# Patient Record
Sex: Female | Born: 1970 | Race: White | Hispanic: No | Marital: Married | Smoking: Never smoker
Health system: Southern US, Community
[De-identification: ages and names within clinical notes are randomized; demographics above are authoritative.]

## PROBLEM LIST (undated history)

## (undated) DIAGNOSIS — N912 Amenorrhea, unspecified: Secondary | ICD-10-CM

## (undated) DIAGNOSIS — K8020 Calculus of gallbladder without cholecystitis without obstruction: Secondary | ICD-10-CM

## (undated) DIAGNOSIS — O1490 Unspecified pre-eclampsia, unspecified trimester: Secondary | ICD-10-CM

## (undated) HISTORY — DX: Unspecified pre-eclampsia, unspecified trimester: O14.90

## (undated) HISTORY — DX: Amenorrhea, unspecified: N91.2

---

## 1999-11-07 DIAGNOSIS — O1490 Unspecified pre-eclampsia, unspecified trimester: Secondary | ICD-10-CM

## 1999-11-07 DIAGNOSIS — O321XX0 Maternal care for breech presentation, not applicable or unspecified: Secondary | ICD-10-CM

## 2015-01-29 ENCOUNTER — Telehealth: Payer: Self-pay

## 2015-01-29 NOTE — Telephone Encounter (Signed)
Called patient and couldn't leave her a vm to call me back. I will call her later on again./MB  > From: Lorenza BurtonJONES, KANDICE L > To: Manahil Vanzile > Sent: 01/28/2015 11:29 AM > Please call patient to let her know that blood count, liver tests, celiac disease & thyroid labs all normal. Thanks

## 2015-01-30 NOTE — Progress Notes (Signed)
This encounter was created in error - please disregard.

## 2015-01-31 NOTE — Telephone Encounter (Signed)
Called patient and couldn't leave her a vm so I will send her a letter to call me back.

## 2015-02-05 ENCOUNTER — Telehealth: Payer: Self-pay | Admitting: Urgent Care

## 2015-02-05 NOTE — Telephone Encounter (Signed)
Pt has called about letter recieved in mail. Lab results were discussed. Pt was advised to call office to make appointment PRN.

## 2015-07-02 ENCOUNTER — Ambulatory Visit (INDEPENDENT_AMBULATORY_CARE_PROVIDER_SITE_OTHER): Payer: 59 | Admitting: Primary Care

## 2015-07-02 ENCOUNTER — Encounter: Payer: Self-pay | Admitting: Primary Care

## 2015-07-02 VITALS — BP 146/94 | HR 84 | Temp 97.8°F | Ht 66.5 in | Wt 155.8 lb

## 2015-07-02 DIAGNOSIS — R1084 Generalized abdominal pain: Secondary | ICD-10-CM | POA: Diagnosis not present

## 2015-07-02 DIAGNOSIS — IMO0001 Reserved for inherently not codable concepts without codable children: Secondary | ICD-10-CM | POA: Insufficient documentation

## 2015-07-02 DIAGNOSIS — R030 Elevated blood-pressure reading, without diagnosis of hypertension: Secondary | ICD-10-CM | POA: Diagnosis not present

## 2015-07-02 NOTE — Progress Notes (Signed)
Subjective:    Patient ID: Ashley Moss, female    DOB: 04/06/1971, 44 y.o.   MRN: [id number]  HPI  Ashley Moss is a 44 year old female who presents today to establish care and discuss the problems mentioned below. Will obtain old records. She had a wellness screen at work and tested her BMI, BP, and cholesterol.   1) Abdominal Pain: Present for the past 5-6 years intermittently and occur after eating, and sometimes after drinking. Over the past weeks she's noticed an increase in her symptoms. Her symptoms include: burning and bloating sensation each time she eats; tightness to her skin with discomfort requiring her to take her bra and work clothes off for relief; generalized abdominal discomfort with radiation to flanks bilaterally. She's been evaluated by two different GI specialists within the past 2 years without conclusion. She's tried eliminating dairy and gluten without improvement. Her symptoms are more noticeable with constipation, but will still occur when she's having regular bowel movements. She's not undergone colonoscopy or endoscopy. She tried taking Miralax, magnesium, linzess without improvement in symptoms. Denies fevers, vomiting, blood in stools. She's had some nausea. Her pain/discomfort will improve after putting on lose fitting clothes, and taking a warm bath. Denies fatigue, body aches, back pain.  Her diet currently consists of: Breakfast: English muffin or cereal Lunch: Soup, [place] sandwich, veggies and peanut butter, granola bar Dinner: Tacos, pasta, grilled chicken Snacks: Occasionally with peanut butter crackers, muffin. Desserts: Rare Beverages: Water, crystal light  Exercise: She is not routinely exercising   2) Elevated Blood Pressure Reading: Elevated in clinic today at 146/94. She's had this occur at her GYN office. She had a recent wellness screening at work and her BP was normal per patient. Denies chest pain, dizziness, shortness of  breath.   Review of Systems  Constitutional: Negative for fatigue and unexpected weight change.  HENT: Negative for rhinorrhea.   Respiratory: Negative for cough and shortness of breath.   Cardiovascular: Negative for chest pain.  Gastrointestinal: Positive for nausea, abdominal pain and constipation. Negative for vomiting and blood in stool.  Genitourinary: Negative for difficulty urinating.  Musculoskeletal: Negative for myalgias and arthralgias.  Skin: Negative for rash.  Allergic/Immunologic: Positive for environmental allergies.  Neurological: Positive for headaches.  Psychiatric/Behavioral:       Denies concerns for anxiety or depression       No past medical history on file.  Social History   Social History  . Marital Status: Married    Spouse Name: N/A  . Number of Children: N/A  . Years of Education: N/A   Occupational History  . Not on file.   Social History Main Topics  . Smoking status: Never Smoker   . Smokeless tobacco: Not on file  . Alcohol Use: 0.0 oz/week    0 Standard drinks or equivalent per week     Comment: soical  . Drug Use: Not on file  . Sexual Activity: Not on file   Other Topics Concern  . Not on file   Social History Narrative  . No narrative on file    No past surgical history on file.  No family history on file.  No Known Allergies  No current outpatient prescriptions on file prior to visit.   No current facility-administered medications on file prior to visit.    BP 146/94 mmHg  Pulse 84  Temp(Src) 97.8 F (36.6 C) (Oral)  Ht 5' 6.5" (1.689 m)  Wt 155 lb 12.8 oz (70.67 kg)  BMI 24.77 kg/m2  SpO2 98%    Objective:   Physical Exam  Constitutional: She is oriented to person, place, and time. She appears well-nourished.  Neck: Neck supple.  Cardiovascular: Normal rate and regular rhythm.   Pulmonary/Chest: Effort normal and breath sounds normal.  Abdominal: Soft. Bowel sounds are normal. There is generalized  tenderness. There is no CVA tenderness, no tenderness at McBurney's point and negative Murphy's sign.  Musculoskeletal:  Negative pressure points for fibromyalgia assessment.  Neurological: She is alert and oriented to person, place, and time.  Skin: Skin is warm and dry.  Psychiatric: She has a normal mood and affect.          Assessment & Plan:

## 2015-07-02 NOTE — Assessment & Plan Note (Signed)
Odd symptoms and presentation. Bloating, abdominal discomfort, sensitive to tight fitting clothes. Pain relief with removal of bra and work clothes and after warm bath. Negative assessment for fibromyalgia pressure points. Do not suspect autoimmune origin.  Generalized tenderness during exam. Will get basic labs today. Will obtain records. Will have her start on daily omeprazole 20 mg. Will contact patient once I receive records.

## 2015-07-02 NOTE — Assessment & Plan Note (Signed)
Elevated reading in office today, about the same on recheck. This has occurred before with GYN office. Health screening at work with normal reading per patient. She will send me a copy of her report. Will continue to monitor.

## 2015-07-02 NOTE — Progress Notes (Signed)
Pre visit review using our clinic review tool, if applicable. No additional management support is needed unless otherwise documented below in the visit note. 

## 2015-07-02 NOTE — Patient Instructions (Signed)
Start taking Omeprazole 20 mg daily for 4-6 weeks. This may be purchased over the counter. This may help with burning.   Complete lab work prior to leaving today. I will notify you of your results.  Please allow me some time to review your records to determine our next steps.  It was a pleasure to meet you today! Please don't hesitate to call me with any questions. Welcome to [facility]!

## 2015-07-03 LAB — CBC WITH DIFFERENTIAL/PLATELET
BASOS ABS: 0 10*3/uL (ref 0.0–0.2)
Basos: 0 %
EOS (ABSOLUTE): 0.1 10*3/uL (ref 0.0–0.4)
EOS: 1 %
HEMATOCRIT: 38.6 % (ref 34.0–46.6)
HEMOGLOBIN: 12.9 g/dL (ref 11.1–15.9)
IMMATURE GRANULOCYTES: 0 %
Immature Grans (Abs): 0 10*3/uL (ref 0.0–0.1)
Lymphocytes Absolute: 2.4 10*3/uL (ref 0.7–3.1)
Lymphs: 27 %
MCH: 28.7 pg (ref 26.6–33.0)
MCHC: 33.4 g/dL (ref 31.5–35.7)
MCV: 86 fL (ref 79–97)
MONOCYTES: 6 %
MONOS ABS: 0.5 10*3/uL (ref 0.1–0.9)
NEUTROS PCT: 66 %
Neutrophils Absolute: 6 10*3/uL (ref 1.4–7.0)
Platelets: 253 10*3/uL (ref 150–379)
RBC: 4.5 x10E6/uL (ref 3.77–5.28)
RDW: 12.8 % (ref 12.3–15.4)
WBC: 9.1 10*3/uL (ref 3.4–10.8)

## 2015-07-03 LAB — COMPREHENSIVE METABOLIC PANEL
A/G RATIO: 1.6 (ref 1.1–2.5)
ALT: 9 IU/L (ref 0–32)
AST: 15 IU/L (ref 0–40)
Albumin: 4.2 g/dL (ref 3.5–5.5)
Alkaline Phosphatase: 54 IU/L (ref 39–117)
BILIRUBIN TOTAL: 0.3 mg/dL (ref 0.0–1.2)
BUN/Creatinine Ratio: 13 (ref 9–23)
BUN: 7 mg/dL (ref 6–24)
CALCIUM: 9.3 mg/dL (ref 8.7–10.2)
CHLORIDE: 101 mmol/L (ref 97–106)
CO2: 23 mmol/L (ref 18–29)
CREATININE: 0.56 mg/dL — AB (ref 0.57–1.00)
GFR calc Af Amer: 132 mL/min/{1.73_m2} (ref >59)
GFR calc non Af Amer: 115 mL/min/{1.73_m2} (ref >59)
GLOBULIN, TOTAL: 2.6 g/dL (ref 1.5–4.5)
Glucose: 103 mg/dL — ABNORMAL HIGH (ref 65–99)
Potassium: 4.7 mmol/L (ref 3.5–5.2)
Sodium: 139 mmol/L (ref 136–144)
TOTAL PROTEIN: 6.8 g/dL (ref 6.0–8.5)

## 2015-07-03 LAB — LIPASE: LIPASE: 60 U/L — AB (ref 0–59)

## 2015-07-07 ENCOUNTER — Encounter: Payer: Self-pay | Admitting: *Deleted

## 2015-07-14 ENCOUNTER — Telehealth: Payer: Self-pay | Admitting: Primary Care

## 2015-07-14 NOTE — Telephone Encounter (Signed)
Pt returned your call best number to call [phone] She goes to lunch between 1-2

## 2015-07-15 NOTE — Telephone Encounter (Signed)
Pt returned your call work number [phone]

## 2015-07-16 NOTE — Telephone Encounter (Signed)
Tried to call patient at work number but did not get respond. However, already sent patient letter with lab results and Kate's comments.

## 2015-07-17 NOTE — Telephone Encounter (Signed)
Patient returned Chan's call.  Please call her back at work  [phone].

## 2015-07-17 NOTE — Telephone Encounter (Signed)
Called patient. Notified patient that since could not get a hold of her after her visit on 07/02/2015, I sent a letter with the lab results and Kate's comments. Patient verbalized understanding and she did get the letter. Patient stated that she wanted [name] to know that patient gets pain with just about anytime when she eat certain food. Patient stated that is it possible to be salt intolerance, it there just a thing? Since she ate some chicken noodle soup and then one time she ate some french fries. She was having some abdominal pain. Please advise. Patient is aware that [name] will not be back until Monday.

## 2015-07-17 NOTE — Telephone Encounter (Signed)
This sounds like reflux. Has she been taking the Prilosec 20 mg daily?

## 2015-07-18 NOTE — Telephone Encounter (Signed)
Message left for patient to return my call.  

## 2015-07-21 ENCOUNTER — Telehealth: Payer: Self-pay | Admitting: Primary Care

## 2015-07-21 NOTE — Telephone Encounter (Signed)
Patient is taking omeprazole as discussed. Patient wanted to know any other suggestions.

## 2015-07-21 NOTE — Telephone Encounter (Signed)
If she's found that her pain is caused by certain foods (i.e. Salty foods), then I highly recommend she avoid them. Has the omeprazole helped at all?

## 2015-07-21 NOTE — Telephone Encounter (Signed)
Tried to call patient. Phone kept ringing. Tried this morning and this afternoon. Will try again tomorrow.

## 2015-07-23 NOTE — Telephone Encounter (Signed)
Called and notified patient of Ashley Moss's comments. Patient verbalized understanding. Patient wanted to know if [name] have seen her records. Patient taking omeprazole but still have pain.

## 2015-07-25 ENCOUNTER — Telehealth: Payer: Self-pay | Admitting: Primary Care

## 2015-07-25 NOTE — Telephone Encounter (Signed)
I did receive her records recently and reviewed them. It doesn't appear that she's had an a CT scan completed of her abdomen.   1. Has she had a CT scan done? If not, then I think this would be the next step in investigating her symptoms. 2. Does she have a copy her her recent lab work that was completed through her occupation? If so, will she send over? 3. Has she ever had an endoscopy or colonoscopy, it doesn't appear that way from the records I've received. 4. Has GI ever discussed endoscopy or colonoscopy?

## 2015-07-28 NOTE — Telephone Encounter (Signed)
Message left for patient to return my call.  

## 2015-07-29 NOTE — Telephone Encounter (Signed)
Message left for patient to return my call from patient's work number [phone]

## 2015-07-30 NOTE — Telephone Encounter (Signed)
Called and asked patient of Kate's questions. Patient verbalized understanding.  Patient stated that  1. Not sure if ever had CT scan but did have x-ray. 2. She will send a copy of her recent labs from work, she have look for it and will 3. No, she never had an endoscopy or colonoscopy. 4. No, she have not discussed.

## 2015-07-31 ENCOUNTER — Telehealth: Payer: Self-pay | Admitting: Primary Care

## 2015-07-31 DIAGNOSIS — R1084 Generalized abdominal pain: Secondary | ICD-10-CM

## 2015-07-31 NOTE — Telephone Encounter (Signed)
Lets do a CT scan for further evaluation if her symptoms are unchanged. I have placed the orders and someone will be in touch with her soon.

## 2015-08-01 ENCOUNTER — Other Ambulatory Visit: Payer: Self-pay | Admitting: Primary Care

## 2015-08-01 ENCOUNTER — Telehealth: Payer: Self-pay | Admitting: Primary Care

## 2015-08-01 NOTE — Telephone Encounter (Signed)
Called patient and she stated that she would like to know how much would be the CT scan. She is having some financial issues with other medical bills so like to know first..

## 2015-08-01 NOTE — Telephone Encounter (Signed)
Spoke to patient and she wanted to know how much would the CT scan be out of pocket. The reason is patient have a medical bill when her son was in the hospital and a bill from husband's colonscopy. Price on CT Abdomen Pelvis W Contrast.

## 2015-08-01 NOTE — Telephone Encounter (Signed)
The price varies with different insurances. She should call her insurance provider for the answer to that question. We can hold off until she finds out.

## 2016-01-21 ENCOUNTER — Telehealth: Payer: Self-pay | Admitting: *Deleted

## 2016-01-21 DIAGNOSIS — R1084 Generalized abdominal pain: Secondary | ICD-10-CM

## 2016-01-21 NOTE — Telephone Encounter (Signed)
Will order CT scan. Please notify patient that someone will be in contact with her soon. She will also need updated labs to ensure kidney function is stable. Please set her up for lab only appointment tomorrow or Friday this week.

## 2016-01-21 NOTE — Telephone Encounter (Signed)
Patient left a voicemail stating that she has been seen for abdominal pain in the past and her symptoms have not gotten any better. Patient wants to know if you can order a CT scan for her?

## 2016-01-21 NOTE — Telephone Encounter (Signed)
Spoken and notified patient of Kate's comments. Patient verbalized understanding.  Lab appt on 01/22/16

## 2016-01-22 ENCOUNTER — Other Ambulatory Visit (INDEPENDENT_AMBULATORY_CARE_PROVIDER_SITE_OTHER): Payer: 59

## 2016-01-22 DIAGNOSIS — R1084 Generalized abdominal pain: Secondary | ICD-10-CM

## 2016-01-23 LAB — COMPREHENSIVE METABOLIC PANEL
A/G RATIO: 1.5 (ref 1.2–2.2)
ALBUMIN: 4.3 g/dL (ref 3.5–5.5)
ALT: 13 IU/L (ref 0–32)
AST: 19 IU/L (ref 0–40)
Alkaline Phosphatase: 60 IU/L (ref 39–117)
BUN / CREAT RATIO: 19 (ref 9–23)
BUN: 12 mg/dL (ref 6–24)
Bilirubin Total: 0.3 mg/dL (ref 0.0–1.2)
CALCIUM: 9.8 mg/dL (ref 8.7–10.2)
CO2: 19 mmol/L (ref 18–29)
CREATININE: 0.63 mg/dL (ref 0.57–1.00)
Chloride: 103 mmol/L (ref 96–106)
GFR calc Af Amer: 126 mL/min/{1.73_m2} (ref >59)
GFR, EST NON AFRICAN AMERICAN: 109 mL/min/{1.73_m2} (ref >59)
GLOBULIN, TOTAL: 2.9 g/dL (ref 1.5–4.5)
Glucose: 101 mg/dL — ABNORMAL HIGH (ref 65–99)
POTASSIUM: 5.2 mmol/L (ref 3.5–5.2)
SODIUM: 142 mmol/L (ref 134–144)
Total Protein: 7.2 g/dL (ref 6.0–8.5)

## 2016-01-30 ENCOUNTER — Ambulatory Visit
Admission: RE | Admit: 2016-01-30 | Discharge: 2016-01-30 | Disposition: A | Discharge status: Home / Self Care | Payer: 59 | Source: Ambulatory Visit | Attending: Primary Care | Admitting: Primary Care

## 2016-01-30 DIAGNOSIS — K8020 Calculus of gallbladder without cholecystitis without obstruction: Secondary | ICD-10-CM | POA: Insufficient documentation

## 2016-01-30 DIAGNOSIS — R1084 Generalized abdominal pain: Secondary | ICD-10-CM

## 2016-01-30 MED ORDER — IOPAMIDOL (ISOVUE-300) INJECTION 61%
100.0000 mL | Freq: Once | INTRAVENOUS | Status: AC | PRN
  Administered 2016-01-30: 100 mL via INTRAVENOUS

## 2016-02-09 ENCOUNTER — Encounter: Payer: Self-pay | Admitting: Primary Care

## 2016-02-09 ENCOUNTER — Ambulatory Visit (INDEPENDENT_AMBULATORY_CARE_PROVIDER_SITE_OTHER): Payer: 59 | Admitting: Primary Care

## 2016-02-09 VITALS — BP 130/80 | HR 96 | Temp 98.4°F | Ht 67.0 in | Wt 155.1 lb

## 2016-02-09 DIAGNOSIS — R030 Elevated blood-pressure reading, without diagnosis of hypertension: Secondary | ICD-10-CM | POA: Diagnosis not present

## 2016-02-09 DIAGNOSIS — K8020 Calculus of gallbladder without cholecystitis without obstruction: Secondary | ICD-10-CM | POA: Diagnosis not present

## 2016-02-09 DIAGNOSIS — R1084 Generalized abdominal pain: Secondary | ICD-10-CM | POA: Diagnosis not present

## 2016-02-09 DIAGNOSIS — IMO0001 Reserved for inherently not codable concepts without codable children: Secondary | ICD-10-CM

## 2016-02-09 NOTE — Progress Notes (Signed)
Subjective:    Patient ID: Ashley Moss, female    DOB: 05/02/1971, 45 y.o.   MRN: [id number]  HPI  Ashley Moss is a 45 year old female who presents today for follow up of abdominal pain. She was evaluated as a new patient in November 2016 with complaints of abdominal discomfort, bloating, clothing fitting too tightly at the end of the day. She had prior work up per GI in May 2016 who had an unremarkable workup at the time. She underwent lab testing in November 2016 through our clinic which was unremarkable. It was advised last visit that she undergo CT of her abdomen, but she declined as she could not afford at the time.   She called several weeks ago with continued complaints of symptoms without improvement on PPI and was agreeable to the CT scan. She recently underwent CT scan of her abdomen/pelvis which showed cholelithiasis.   Since her last visit she's continued to notice discomfort and bloating to her bilateral lower abdomen and lateral sides. Her symptoms will wax and wane with various days in no particular pattern. She feels as though her sides are "bruised" by the end of the day in regards to the tightness of her close caused by bloating. Her discomfort is mainly located to her right lower side but does experience epigastric and right upper quadrant discomfort. She typically experiences discomfort after eating. She's tried switching her diet mulitple times and has not found improvement. She is having bowel movements 3-4 times weekly. Denies diarrhea, nausea, vomiting, bloody stools. She has never undergone food allergy testing.    Review of Systems  Constitutional: Negative for appetite change and unexpected weight change.  Gastrointestinal: Positive for abdominal pain. Negative for nausea, vomiting, diarrhea, constipation and blood in stool.       Bloating  Neurological: Negative for weakness.       No past medical history on file.   Social History   Social History  .  Marital Status: Married    Spouse Name: N/A  . Number of Children: N/A  . Years of Education: N/A   Occupational History  . Not on file.   Social History Main Topics  . Smoking status: Never Smoker   . Smokeless tobacco: Not on file  . Alcohol Use: 0.0 oz/week    0 Standard drinks or equivalent per week     Comment: soical  . Drug Use: Not on file  . Sexual Activity: Not on file   Other Topics Concern  . Not on file   Social History Narrative    No past surgical history on file.  No family history on file.  No Known Allergies  Current Outpatient Prescriptions on File Prior to Visit  Medication Sig Dispense Refill  . levonorgestrel-ethinyl estradiol (AVIANE,ALESSE,LESSINA) 0.1-20 MG-MCG tablet Take 1 tablet by mouth daily.      No current facility-administered medications on file prior to visit.    BP 130/80 mmHg  Pulse 96  Temp(Src) 98.4 F (36.9 C) (Oral)  Ht 5\' 7"  (1.702 m)  Wt 155 lb 1.9 oz (70.362 kg)  BMI 24.29 kg/m2  SpO2 97%  LMP 01/13/2016 (Approximate)    Objective:   Physical Exam  Constitutional: She appears well-nourished.  Cardiovascular: Normal rate and regular rhythm.   Pulmonary/Chest: Effort normal and breath sounds normal.  Abdominal: Soft. There is tenderness in the right upper quadrant, right lower quadrant and epigastric area. There is no rebound, no guarding and negative Murphy's sign.  Skin:  Skin is warm and dry.          Assessment & Plan:

## 2016-02-09 NOTE — Patient Instructions (Signed)
You will be contacted regarding your referral to General Surgery.  Please let us know if you have not heard back within one week.   It was a pleasure to see you today!

## 2016-02-09 NOTE — Assessment & Plan Note (Signed)
Continues to experience abdominal symptoms. Recent CT results do show cholelithiasis without cholecystitis. Given her persistent symptoms I do believe it is worthy of general surgery consult. Exam with tenderness to right upper quadrant and epigastric region without a positive Murphy's sign. Recent labs unremarkable. Referral placed.

## 2016-02-09 NOTE — Progress Notes (Signed)
Pre visit review using our clinic review tool, if applicable. No additional management support is needed unless otherwise documented below in the visit note. 

## 2016-02-09 NOTE — Assessment & Plan Note (Signed)
Stable today. We'll continue to monitor.

## 2016-02-10 ENCOUNTER — Encounter: Payer: Self-pay | Admitting: *Deleted

## 2016-02-11 ENCOUNTER — Ambulatory Visit (INDEPENDENT_AMBULATORY_CARE_PROVIDER_SITE_OTHER): Payer: 59 | Admitting: General Surgery

## 2016-02-11 ENCOUNTER — Encounter: Payer: Self-pay | Admitting: General Surgery

## 2016-02-11 VITALS — BP 122/76 | HR 74 | Resp 12 | Ht 67.0 in | Wt 154.0 lb

## 2016-02-11 DIAGNOSIS — K8020 Calculus of gallbladder without cholecystitis without obstruction: Secondary | ICD-10-CM | POA: Diagnosis not present

## 2016-02-11 NOTE — Patient Instructions (Addendum)
Laparoscopic Cholecystectomy Laparoscopic cholecystectomy is surgery to remove the gallbladder. The gallbladder is located in the upper right part of the abdomen, behind the liver. It is a storage sac for bile, which is produced in the liver. Bile aids in the digestion and absorption of fats. Cholecystectomy is often done for inflammation of the gallbladder (cholecystitis). This condition is usually caused by a buildup of gallstones (cholelithiasis) in the gallbladder. Gallstones can block the flow of bile, and that can result in inflammation and pain. In severe cases, emergency surgery may be required. If emergency surgery is not required, you will have time to prepare for the procedure. Laparoscopic surgery is an alternative to open surgery. Laparoscopic surgery has a shorter recovery time. Your common bile duct may also need to be examined during the procedure. If stones are found in the common bile duct, they may be removed. LET YOUR HEALTH CARE PROVIDER KNOW ABOUT:  Any allergies you have.  All medicines you are taking, including vitamins, herbs, eye drops, creams, and over-the-counter medicines.  Previous problems you or members of your family have had with the use of anesthetics.  Any blood disorders you have.  Previous surgeries you have had.  Any medical conditions you have. RISKS AND COMPLICATIONS Generally, this is a safe procedure. However, problems may occur, including:  Infection.  Bleeding.  Allergic reactions to medicines.  Damage to other structures or organs.  A stone remaining in the common bile duct.  A bile leak from the cyst duct that is clipped when your gallbladder is removed.  The need to convert to open surgery, which requires a larger incision in the abdomen. This may be necessary if your surgeon thinks that it is not safe to continue with a laparoscopic procedure. BEFORE THE PROCEDURE  Ask your health care provider about:  Changing or stopping your  regular medicines. This is especially important if you are taking diabetes medicines or blood thinners.  Taking medicines such as aspirin and ibuprofen. These medicines can thin your blood. Do not take these medicines before your procedure if your health care provider instructs you not to.  Follow instructions from your health care provider about eating or drinking restrictions.  Let your health care provider know if you develop a cold or an infection before surgery.  Plan to have someone take you home after the procedure.  Ask your health care provider how your surgical site will be marked or identified.  You may be given antibiotic medicine to help prevent infection. PROCEDURE  To reduce your risk of infection:  Your health care team will wash or sanitize their hands.  Your skin will be washed with soap.  An IV tube may be inserted into one of your veins.  You will be given a medicine to make you fall asleep (general anesthetic).  A breathing tube will be placed in your mouth.  The surgeon will make several small cuts (incisions) in your abdomen.  A thin, lighted tube (laparoscope) that has a tiny camera on the end will be inserted through one of the small incisions. The camera on the laparoscope will send a picture to a TV screen (monitor) in the operating room. This will give the surgeon a good view inside your abdomen.  A gas will be pumped into your abdomen. This will expand your abdomen to give the surgeon more room to perform the surgery.  Other tools that are needed for the procedure will be inserted through the other incisions. The gallbladder will   be removed through one of the incisions.  After your gallbladder has been removed, the incisions will be closed with stitches (sutures), staples, or skin glue.  Your incisions may be covered with a bandage (dressing). The procedure may vary among health care providers and hospitals. AFTER THE PROCEDURE  Your blood  pressure, heart rate, breathing rate, and blood oxygen level will be monitored often until the medicines you were given have worn off.  You will be given medicines as needed to control your pain.   This information is not intended to replace advice given to you by your health care provider. Make sure you discuss any questions you have with your health care provider.   Document Released: 08/16/2005 Document Revised: 05/07/2015 Document Reviewed: 03/28/2013 Elsevier Interactive Patient Education 2016 [employer]  Patient's surgery has been [facility].

## 2016-02-11 NOTE — Progress Notes (Signed)
Patient ID: Ashley Moss, female   DOB: July 12, 1971, 45 y.o.   MRN: [id number]  Chief Complaint  Patient presents with  . Abdominal Pain    HPI Ashley Moss is a 45 y.o. female here today for a evaluation of abdominal pain.The main pain is in her right  side below the costal margin, but does experience epigastric and right llower quadrant discomfort as well. Feels bloating, which becomes more pronounced is a was on. Discomfort after eating, although she has not noticed any discrete dietary intolerance. Pain can radiate into the right subscapular region when severe.. This has been going on for over five year now but, more in pain in the last year.  Denies diarrhea, nausea, vomiting. Moves her bowel movements 3-4 times weekly.Patient had a ct scan on 01/30/16.   I personally reviewed the patient's history HPI  History reviewed. No pertinent past medical history.  Past Surgical History  Procedure Laterality Date  . Cesarean section  2001    History reviewed. No pertinent family history.  Social History Social History  Substance Use Topics  . Smoking status: Never Smoker   . Smokeless tobacco: None  . Alcohol Use: 0.0 oz/week    0 Standard drinks or equivalent per week     Comment: soical    No Known Allergies  Current Outpatient Prescriptions  Medication Sig Dispense Refill  . levonorgestrel-ethinyl estradiol (AVIANE,ALESSE,LESSINA) 0.1-20 MG-MCG tablet Take 1 tablet by mouth daily.      No current facility-administered medications for this visit.    Review of Systems Review of Systems  Constitutional: Negative.   Respiratory: Negative.   Cardiovascular: Negative.     Blood pressure 122/76, pulse 74, resp. rate 12, height  (1.702 m), weight 154 lb (69.854 kg), last menstrual period 01/13/2016.  Physical Exam Physical Exam  Constitutional: She is oriented to person, place, and time. She appears well-developed and well-nourished.  Eyes: Conjunctivae  are normal. No scleral icterus.  Neck: Neck supple.  Cardiovascular: Normal rate, regular rhythm and normal heart sounds.   Pulmonary/Chest: Effort normal and breath sounds normal.  Abdominal: Soft. Normal appearance and bowel sounds are normal. There is no hepatomegaly. There is tenderness in the right upper quadrant.  Lymphadenopathy:    She has no cervical adenopathy.  Neurological: She is alert and oriented to person, place, and time.  Skin: Skin is warm.    Data Reviewed 01/30/2016 CT scan of the abdomen and pelvis was reviewed. Multiple gallstones without evidence of acute cholecystitis. 01/22/2016 competence metabolic panel unremarkable except for a blood sugar of 101.  Assessment    Symptomatic cholelithiasis.    Plan     Laparoscopic Cholecystectomy with Intraoperative Cholangiogram. The procedure, including it's potential risks and complications (including but not limited to infection, bleeding, injury to intra-abdominal organs or bile ducts, bile leak, poor cosmetic result, sepsis and death) were discussed with the patient in detail. Non-operative options, including their inherent risks (acute calculous cholecystitis with possible choledocholithiasis or gallstone pancreatitis, with the risk of ascending cholangitis, sepsis, and death) were discussed as well. The patient expressed and understanding of what we discussed and wishes to proceed with laparoscopic cholecystectomy. The patient further understands that if it is technically not possible, or it is unsafe to proceed laparoscopically, that I will convert to an open cholecystectomy.  Patient's surgery has been scheduled for 03-04-16 at [facility].    PCP:  Vernona Rieger  This information has been scribed by Ples Specter CMA.  Earline MayotteByrnett, Courtenay Hirth W 02/12/2016, 4:22 PM

## 2016-02-12 DIAGNOSIS — K8020 Calculus of gallbladder without cholecystitis without obstruction: Secondary | ICD-10-CM | POA: Insufficient documentation

## 2016-02-12 NOTE — H&P (Signed)
HPI Ashley BurowsStephanie Lane Moss is a 45 y.o. female here today for a evaluation of abdominal pain.The main pain is in her right  side below the costal margin, but does experience epigastric and right llower quadrant discomfort as well. Feels bloating, which becomes more pronounced is a was on. Discomfort after eating, although she has not noticed any discrete dietary intolerance. Pain can radiate into the right subscapular region when severe.. This has been going on for over five year now but, more in pain in the last year.  Denies diarrhea, nausea, vomiting. Moves her bowel movements 3-4 times weekly.Patient had a ct scan on 01/30/16.   I personally reviewed the patient's history HPI  History reviewed. No pertinent past medical history.  Past Surgical History  Procedure Laterality Date  . Cesarean section  2001    History reviewed. No pertinent family history.  Social History Social History  Substance Use Topics  . Smoking status: Never Smoker   . Smokeless tobacco: None  . Alcohol Use: 0.0 oz/week    0 Standard drinks or equivalent per week     Comment: soical    No Known Allergies  Current Outpatient Prescriptions  Medication Sig Dispense Refill  . levonorgestrel-ethinyl estradiol (AVIANE,ALESSE,LESSINA) 0.1-20 MG-MCG tablet Take 1 tablet by mouth daily.      No current facility-administered medications for this visit.    Review of Systems Review of Systems  Constitutional: Negative.   Respiratory: Negative.   Cardiovascular: Negative.     Blood pressure 122/76, pulse 74, resp. rate 12, height 5\' 7"  (1.702 m), weight 154 lb (69.854 kg), last menstrual period 01/13/2016.  Physical Exam Physical Exam  Constitutional: She is oriented to person, place, and time. She appears well-developed and well-nourished.  Eyes: Conjunctivae are normal. No scleral icterus.  Neck: Neck supple.  Cardiovascular: Normal rate, regular rhythm and normal heart sounds.   Pulmonary/Chest: Effort  normal and breath sounds normal.  Abdominal: Soft. Normal appearance and bowel sounds are normal. There is no hepatomegaly. There is tenderness in the right upper quadrant.  Lymphadenopathy:    She has no cervical adenopathy.  Neurological: She is alert and oriented to person, place, and time.  Skin: Skin is warm.    Data Reviewed 01/30/2016 CT scan of the abdomen and pelvis was reviewed. Multiple gallstones without evidence of acute cholecystitis. 01/22/2016 competence metabolic panel unremarkable except for a blood sugar of 101.  Assessment    Symptomatic cholelithiasis.    Plan     Laparoscopic Cholecystectomy with Intraoperative Cholangiogram. The procedure, including it's potential risks and complications (including but not limited to infection, bleeding, injury to intra-abdominal organs or bile ducts, bile leak, poor cosmetic result, sepsis and death) were discussed with the patient in detail. Non-operative options, including their inherent risks (acute calculous cholecystitis with possible choledocholithiasis or gallstone pancreatitis, with the risk of ascending cholangitis, sepsis, and death) were discussed as well. The patient expressed and understanding of what we discussed and wishes to proceed with laparoscopic cholecystectomy. The patient further understands that if it is technically not possible, or it is unsafe to proceed laparoscopically, that I will convert to an open cholecystectomy.  [facility].    PCP:  Vernona Riegerlark, Katherine  This information has been scribed by Ples SpecterJessica Qualls CMA.    Earline MayotteByrnett, Ashley Moss 02/12/2016, 4:22 PM

## 2016-02-17 ENCOUNTER — Telehealth: Payer: Self-pay | Admitting: *Deleted

## 2016-02-17 NOTE — Telephone Encounter (Signed)
RTW 03-15-16

## 2016-02-17 NOTE — Telephone Encounter (Signed)
Need a return to work date for [employer] papers if possible.

## 2016-02-26 ENCOUNTER — Encounter: Payer: Self-pay | Admitting: *Deleted

## 2016-02-26 ENCOUNTER — Inpatient Hospital Stay: Admission: RE | Admit: 2016-02-26 | Payer: 59 | Source: Ambulatory Visit

## 2016-02-26 NOTE — Patient Instructions (Signed)
  Your procedure is scheduled on: 03-04-16 Report to Same Day Surgery 2nd floor medical mall To find out your arrival time please call [phone] between 1PM - 3PM on 03-03-16  Remember: Instructions that are not followed completely may result in serious medical risk, up to and including death, or upon the discretion of your surgeon and anesthesiologist your surgery may need to be rescheduled.    _x___ 1. Do not eat food or drink liquids after midnight. No gum chewing or hard candies.     __x__ 2. No Alcohol for 24 hours before or after surgery.   __x__3. No Smoking for 24 prior to surgery.   ____  4. Bring all medications with you on the day of surgery if instructed.    __x__ 5. Notify your doctor if there is any change in your medical condition     (cold, fever, infections).     Do not wear jewelry, make-up, hairpins, clips or nail polish.  Do not wear lotions, powders, or perfumes. You may wear deodorant.  Do not shave 48 hours prior to surgery. Men may shave face and neck.  Do not bring valuables to the hospital.    [facility] is not responsible for any belongings or valuables.               Contacts, dentures or bridgework may not be worn into surgery.  Leave your suitcase in the car. After surgery it may be brought to your room.  For patients admitted to the hospital, discharge time is determined by your treatment team.   Patients discharged the day of surgery will not be allowed to drive home.    Please read over the following fact sheets that you were given:   [facility] Preparing for Surgery and or MRSA Information   _x___ Take these medicines the morning of surgery with A SIP OF WATER:    1. BIRTH CONTROL PILL  2.  3.  4.  5.  6.  ____ Fleet Enema (as directed)   ____ Use CHG Soap or sage wipes as directed on instruction sheet   ____ Use inhalers on the day of surgery and bring to hospital day of surgery  ____ Stop metformin 2 days prior to surgery    ____  Take 1/2 of usual insulin dose the night before surgery and none on the morning of surgery.   ____ Stop aspirin or coumadin, or plavix  ___ Stop Anti-inflammatories such as Advil, Aleve, Ibuprofen, Motrin, Naproxen,          Naprosyn, Goodies powders or aspirin products. Ok to take Tylenol.-DR BYRNETT OK WITH HIS PTS CONTINUING NSAIDS   _X___ Stop supplements until after surgery-STOP BIOTIN NOW   ____ Bring C-Pap to the hospital.

## 2016-03-04 ENCOUNTER — Ambulatory Visit: Payer: 59 | Admitting: Anesthesiology

## 2016-03-04 ENCOUNTER — Ambulatory Visit: Payer: 59

## 2016-03-04 ENCOUNTER — Encounter
Admission: RE | Disposition: A | Discharge status: Home / Self Care | Payer: Self-pay | Source: Ambulatory Visit | Attending: General Surgery

## 2016-03-04 ENCOUNTER — Encounter: Payer: Self-pay | Admitting: *Deleted

## 2016-03-04 ENCOUNTER — Ambulatory Visit
Admission: RE | Admit: 2016-03-04 | Discharge: 2016-03-04 | Disposition: A | Discharge status: Home / Self Care | Payer: 59 | Source: Ambulatory Visit | Attending: General Surgery | Admitting: General Surgery

## 2016-03-04 DIAGNOSIS — Q453 Other congenital malformations of pancreas and pancreatic duct: Secondary | ICD-10-CM | POA: Insufficient documentation

## 2016-03-04 DIAGNOSIS — Z793 Long term (current) use of hormonal contraceptives: Secondary | ICD-10-CM | POA: Diagnosis not present

## 2016-03-04 DIAGNOSIS — K8010 Calculus of gallbladder with chronic cholecystitis without obstruction: Secondary | ICD-10-CM | POA: Diagnosis not present

## 2016-03-04 DIAGNOSIS — K8020 Calculus of gallbladder without cholecystitis without obstruction: Secondary | ICD-10-CM

## 2016-03-04 DIAGNOSIS — Z9889 Other specified postprocedural states: Secondary | ICD-10-CM | POA: Insufficient documentation

## 2016-03-04 DIAGNOSIS — K8044 Calculus of bile duct with chronic cholecystitis without obstruction: Secondary | ICD-10-CM | POA: Diagnosis not present

## 2016-03-04 HISTORY — PX: CHOLECYSTECTOMY: SHX55

## 2016-03-04 HISTORY — DX: Calculus of gallbladder without cholecystitis without obstruction: K80.20

## 2016-03-04 LAB — POCT PREGNANCY, URINE: PREG TEST UR: NEGATIVE

## 2016-03-04 SURGERY — LAPAROSCOPIC CHOLECYSTECTOMY WITH INTRAOPERATIVE CHOLANGIOGRAM
Anesthesia: General | Wound class: Clean Contaminated

## 2016-03-04 MED ORDER — ACETAMINOPHEN 10 MG/ML IV SOLN
INTRAVENOUS | Status: AC
  Filled 2016-03-04: qty 100

## 2016-03-04 MED ORDER — GLYCOPYRROLATE 0.2 MG/ML IJ SOLN
INTRAMUSCULAR | Status: DC | PRN
  Administered 2016-03-04: .4 mg via INTRAVENOUS

## 2016-03-04 MED ORDER — HYDROMORPHONE HCL 1 MG/ML IJ SOLN
0.2500 mg | INTRAMUSCULAR | Status: DC | PRN
  Administered 2016-03-04 (×2): 0.25 mg via INTRAVENOUS

## 2016-03-04 MED ORDER — PROPOFOL 10 MG/ML IV BOLUS
INTRAVENOUS | Status: DC | PRN
  Administered 2016-03-04: 140 mg via INTRAVENOUS

## 2016-03-04 MED ORDER — HYDROCODONE-ACETAMINOPHEN 5-325 MG PO TABS
ORAL_TABLET | ORAL | Status: AC
  Administered 2016-03-04: 1 via ORAL
  Filled 2016-03-04: qty 1

## 2016-03-04 MED ORDER — FENTANYL CITRATE (PF) 100 MCG/2ML IJ SOLN
25.0000 ug | INTRAMUSCULAR | Status: DC | PRN
  Administered 2016-03-04 (×4): 25 ug via INTRAVENOUS

## 2016-03-04 MED ORDER — ONDANSETRON HCL 4 MG/2ML IJ SOLN
INTRAMUSCULAR | Status: DC | PRN
Start: 2016-03-04 — End: 2016-03-04
  Administered 2016-03-04: 4 mg via INTRAVENOUS

## 2016-03-04 MED ORDER — ROCURONIUM BROMIDE 100 MG/10ML IV SOLN
INTRAVENOUS | Status: DC | PRN
  Administered 2016-03-04: 30 mg via INTRAVENOUS

## 2016-03-04 MED ORDER — SODIUM CHLORIDE 0.9 % IJ SOLN
INTRAMUSCULAR | Status: AC
  Filled 2016-03-04: qty 50

## 2016-03-04 MED ORDER — PROMETHAZINE HCL 25 MG/ML IJ SOLN
12.5000 mg | Freq: Once | INTRAMUSCULAR | Status: AC
  Administered 2016-03-04: 12.5 mg via INTRAVENOUS

## 2016-03-04 MED ORDER — LIDOCAINE HCL (CARDIAC) 20 MG/ML IV SOLN
INTRAVENOUS | Status: DC | PRN
  Administered 2016-03-04: 100 mg via INTRAVENOUS

## 2016-03-04 MED ORDER — ACETAMINOPHEN 10 MG/ML IV SOLN
INTRAVENOUS | Status: DC | PRN
  Administered 2016-03-04: 1000 mg via INTRAVENOUS

## 2016-03-04 MED ORDER — NEOSTIGMINE METHYLSULFATE 10 MG/10ML IV SOLN
INTRAVENOUS | Status: DC | PRN
  Administered 2016-03-04: 3 mg via INTRAVENOUS

## 2016-03-04 MED ORDER — MIDAZOLAM HCL 2 MG/2ML IJ SOLN
INTRAMUSCULAR | Status: DC | PRN
  Administered 2016-03-04: 2 mg via INTRAVENOUS

## 2016-03-04 MED ORDER — FAMOTIDINE 20 MG PO TABS
ORAL_TABLET | ORAL | Status: AC
  Administered 2016-03-04: 20 mg via ORAL
  Filled 2016-03-04: qty 1

## 2016-03-04 MED ORDER — FENTANYL CITRATE (PF) 100 MCG/2ML IJ SOLN
INTRAMUSCULAR | Status: DC | PRN
  Administered 2016-03-04: 100 ug via INTRAVENOUS
  Administered 2016-03-04: 50 ug via INTRAVENOUS

## 2016-03-04 MED ORDER — SUCCINYLCHOLINE CHLORIDE 20 MG/ML IJ SOLN
INTRAMUSCULAR | Status: DC | PRN
  Administered 2016-03-04: 80 mg via INTRAVENOUS

## 2016-03-04 MED ORDER — PROMETHAZINE HCL 25 MG/ML IJ SOLN
INTRAMUSCULAR | Status: AC
  Administered 2016-03-04: 12.5 mg via INTRAVENOUS
  Filled 2016-03-04: qty 1

## 2016-03-04 MED ORDER — KETOROLAC TROMETHAMINE 30 MG/ML IJ SOLN
INTRAMUSCULAR | Status: DC | PRN
  Administered 2016-03-04: 30 mg via INTRAVENOUS

## 2016-03-04 MED ORDER — FAMOTIDINE 20 MG PO TABS
20.0000 mg | ORAL_TABLET | Freq: Once | ORAL | Status: AC
  Administered 2016-03-04: 20 mg via ORAL

## 2016-03-04 MED ORDER — OXYCODONE HCL 5 MG/5ML PO SOLN: 5.0000 mg | Freq: Once | ORAL | Status: DC | PRN

## 2016-03-04 MED ORDER — HYDROCODONE-ACETAMINOPHEN 5-325 MG PO TABS: 1.0000 | ORAL_TABLET | ORAL | Status: DC | PRN

## 2016-03-04 MED ORDER — OXYCODONE HCL 5 MG PO TABS: 5.0000 mg | ORAL_TABLET | Freq: Once | ORAL | Status: DC | PRN

## 2016-03-04 MED ORDER — LACTATED RINGERS IV SOLN
INTRAVENOUS | Status: DC
  Administered 2016-03-04: 07:00:00 via INTRAVENOUS

## 2016-03-04 MED ORDER — HYDROCODONE-ACETAMINOPHEN 5-325 MG PO TABS
1.0000 | ORAL_TABLET | Freq: Once | ORAL | Status: AC
Start: 2016-03-04 — End: 2016-03-04
  Administered 2016-03-04: 1 via ORAL

## 2016-03-04 MED ORDER — FENTANYL CITRATE (PF) 100 MCG/2ML IJ SOLN
INTRAMUSCULAR | Status: AC
  Administered 2016-03-04: 25 ug via INTRAVENOUS
  Filled 2016-03-04: qty 2

## 2016-03-04 MED ORDER — DEXAMETHASONE SODIUM PHOSPHATE 10 MG/ML IJ SOLN
INTRAMUSCULAR | Status: DC | PRN
  Administered 2016-03-04: 10 mg via INTRAVENOUS

## 2016-03-04 MED ORDER — SODIUM CHLORIDE 0.9 % IJ SOLN
INTRAMUSCULAR | Status: AC
  Filled 2016-03-04: qty 10

## 2016-03-04 MED ORDER — HYDROMORPHONE HCL 1 MG/ML IJ SOLN
INTRAMUSCULAR | Status: AC
  Administered 2016-03-04: 0.25 mg via INTRAVENOUS
  Filled 2016-03-04: qty 1

## 2016-03-04 MED ORDER — IOTHALAMATE MEGLUMINE 60 % INJ SOLN
INTRAMUSCULAR | Status: DC | PRN
  Administered 2016-03-04: 15 mL

## 2016-03-04 SURGICAL SUPPLY — 40 items
APPLIER CLIP ROT 10 11.4 M/L (STAPLE) ×2
BLADE SURG 11 STRL SS SAFETY (MISCELLANEOUS) ×2 IMPLANT
CANISTER SUCT 1200ML W/VALVE (MISCELLANEOUS) ×2 IMPLANT
CANNULA DILATOR 10 W/SLV (CANNULA) ×2 IMPLANT
CANNULA DILATOR 5 W/SLV (CANNULA) ×4 IMPLANT
CATH CHOLANG 76X19 KUMAR (CATHETERS) ×2 IMPLANT
CHLORAPREP W/TINT 26ML (MISCELLANEOUS) ×2 IMPLANT
CLIP APPLIE ROT 10 11.4 M/L (STAPLE) ×1 IMPLANT
CONRAY 60ML FOR OR (MISCELLANEOUS) ×2 IMPLANT
DISSECTOR KITTNER STICK (MISCELLANEOUS) IMPLANT
DISSECTORS/KITTNER STICK (MISCELLANEOUS)
DRAPE SHEET LG 3/4 BI-LAMINATE (DRAPES) ×2 IMPLANT
DRESSING TELFA 4X3 1S ST N-ADH (GAUZE/BANDAGES/DRESSINGS) ×2 IMPLANT
DRSG TEGADERM 2-3/8X2-3/4 SM (GAUZE/BANDAGES/DRESSINGS) ×8 IMPLANT
ELECT REM PT RETURN 9FT ADLT (ELECTROSURGICAL) ×2
ELECTRODE REM PT RTRN 9FT ADLT (ELECTROSURGICAL) ×1 IMPLANT
ENDOPOUCH RETRIEVER 10 (MISCELLANEOUS) ×2 IMPLANT
GLOVE BIO SURGEON STRL SZ7.5 (GLOVE) ×8 IMPLANT
GLOVE INDICATOR 8.0 STRL GRN (GLOVE) ×6 IMPLANT
GOWN STRL REUS W/ TWL LRG LVL3 (GOWN DISPOSABLE) ×3 IMPLANT
GOWN STRL REUS W/TWL LRG LVL3 (GOWN DISPOSABLE) ×3
IRRIGATION STRYKERFLOW (MISCELLANEOUS) ×1 IMPLANT
IRRIGATOR STRYKERFLOW (MISCELLANEOUS) ×2
IV LACTATED RINGERS 1000ML (IV SOLUTION) ×2 IMPLANT
KIT RM TURNOVER STRD PROC AR (KITS) ×2 IMPLANT
LABEL OR SOLS (LABEL) IMPLANT
NDL INSUFF ACCESS 14 VERSASTEP (NEEDLE) ×2 IMPLANT
NS IRRIG 500ML POUR BTL (IV SOLUTION) ×2 IMPLANT
PACK LAP CHOLECYSTECTOMY (MISCELLANEOUS) ×2 IMPLANT
SCISSORS METZENBAUM CVD 33 (INSTRUMENTS) ×2 IMPLANT
SEAL FOR SCOPE WARMER C3101 (MISCELLANEOUS) ×2 IMPLANT
STRIP CLOSURE SKIN 1/2X4 (GAUZE/BANDAGES/DRESSINGS) ×2 IMPLANT
SUT PDS PLUS 0 (SUTURE) ×1
SUT PDS PLUS AB 0 CT-2 (SUTURE) ×1 IMPLANT
SUT VIC AB 0 CT2 27 (SUTURE) ×2 IMPLANT
SUT VIC AB 4-0 FS2 27 (SUTURE) ×2 IMPLANT
SWABSTK COMLB BENZOIN TINCTURE (MISCELLANEOUS) ×2 IMPLANT
TROCAR XCEL NON-BLD 11X100MML (ENDOMECHANICALS) ×2 IMPLANT
TUBING INSUFFLATOR HI FLOW (MISCELLANEOUS) ×2 IMPLANT
WATER STERILE IRR 1000ML POUR (IV SOLUTION) ×2 IMPLANT

## 2016-03-04 NOTE — Anesthesia Procedure Notes (Signed)
Procedure Name: Intubation Date/Time: 03/04/2016 7:22 AM Performed by: Michaele OfferSAVAGE, Sherard Sutch Pre-anesthesia Checklist: Patient identified, Emergency Drugs available, Suction available, Patient being monitored and Timeout performed Patient Re-evaluated:Patient Re-evaluated prior to inductionOxygen Delivery Method: Circle system utilized Preoxygenation: Pre-oxygenation with 100% oxygen Intubation Type: IV induction Ventilation: Mask ventilation without difficulty Laryngoscope Size: Mac and 3 Grade View: Grade I Tube type: Oral Tube size: 7.0 mm Number of attempts: 1 Airway Equipment and Method: Rigid stylet Placement Confirmation: ETT inserted through vocal cords under direct vision,  positive ETCO2 and breath sounds checked- equal and bilateral Secured at: 18 cm Tube secured with: Tape Dental Injury: Teeth and Oropharynx as per pre-operative assessment

## 2016-03-04 NOTE — Discharge Instructions (Signed)
AMBULATORY SURGERY  °DISCHARGE INSTRUCTIONS ° ° °1) The drugs that you were given will stay in your system until tomorrow so for the next 24 hours you should not: ° °A) Drive an automobile °B) Make any legal decisions °C) Drink any alcoholic beverage ° ° °2) You may resume regular meals tomorrow.  Today it is better to start with liquids and gradually work up to solid foods. ° °You may eat anything you prefer, but it is better to start with liquids, then soup and crackers, and gradually work up to solid foods. ° ° °3) Please notify your doctor immediately if you have any unusual bleeding, trouble breathing, redness and pain at the surgery site, drainage, fever, or pain not relieved by medication. ° ° ° °4) Additional Instructions: ° ° ° ° ° ° ° °Please contact your physician with any problems or Same Day Surgery at [phone], Monday through Friday 6 am to 4 pm, or [place] [facility] [place] Main number at [phone]. °

## 2016-03-04 NOTE — H&P (Signed)
No change in clinical history. Lungs: Clear. Cardio: RR. Plan: Lap chole.

## 2016-03-04 NOTE — Progress Notes (Signed)
Taking ice  Nausea better

## 2016-03-04 NOTE — Anesthesia Preprocedure Evaluation (Signed)
Anesthesia Evaluation  Patient identified by MRN, date of birth, ID band Patient awake    Reviewed: Allergy & Precautions, H&P , NPO status , Patient's Chart, lab work & pertinent test results  History of Anesthesia Complications Negative for: history of anesthetic complications  Airway Mallampati: II  TM Distance: >3 FB Neck ROM: full    Dental  (+) Poor Dentition   Pulmonary neg pulmonary ROS, neg shortness of breath,    Pulmonary exam normal breath sounds clear to auscultation       Cardiovascular Exercise Tolerance: Good (-) angina(-) Past MI and (-) DOE negative cardio ROS Normal cardiovascular exam Rhythm:regular Rate:Normal     Neuro/Psych negative neurological ROS  negative psych ROS   GI/Hepatic negative GI ROS, Neg liver ROS, neg GERD  ,  Endo/Other  negative endocrine ROS  Renal/GU negative Renal ROS  negative genitourinary   Musculoskeletal   Abdominal   Peds  Hematology negative hematology ROS (+)   Anesthesia Other Findings Past Medical History:   Gallstones                                                  Past Surgical History:   CESAREAN SECTION                                 2001        BMI    Body Mass Index   24.11 kg/m 2      Reproductive/Obstetrics negative OB ROS                             Anesthesia Physical Anesthesia Plan  ASA: II  Anesthesia Plan: General ETT   Post-op Pain Management:    Induction:   Airway Management Planned:   Additional Equipment:   Intra-op Plan:   Post-operative Plan:   Informed Consent: I have reviewed the patients History and Physical, chart, labs and discussed the procedure including the risks, benefits and alternatives for the proposed anesthesia with the patient or authorized representative who has indicated his/her understanding and acceptance.   Dental Advisory Given  Plan Discussed with: Anesthesiologist,  CRNA and Surgeon  Anesthesia Plan Comments:         Anesthesia Quick Evaluation

## 2016-03-04 NOTE — Op Note (Addendum)
Preoperative diagnosis: Chronic cholecystitis and cholelithiasis.  Postoperative diagnosis: Same.  Operative procedure: Laparoscopic cholecystectomy with intraoperative cholangiograms.  Operating surgeon: Lane HackerJeffery Byrnett, M.D.  Anesthesia: Gen. endotracheal.  Estimated blood loss: Less than 5 mL.  Clinical note: This 45 year old woman has had a long history of episodic abdominal pain. CT scan showed evidence of a large gallstone. She was a peripheral to cholecystectomy.  Operative note: With the patient under adequate general endotracheal anesthesia the abdomen was prepped with ChloraPrep and draped. An Trendelenburg position a varies needle was placed with trans-umbilical incision. After sharing intra-abdominal location with the hanging drop test the abdomen was insufflated with CO2 a 10 mmHg pressure. A 10 mm Step port was expanded and inspection showed no evidence of injury from initial port placement. The patient was placed in the reverse Trendelenburg position and rolled to the left. An 11 mm XL port was placed in the epigastrium followed by 2-5 mm Step ports in the right lateral abdomen. The gallbladder showed no scarring. It was placed on cephalad traction. The neck of the gallbladder was cleared and the cystic duct and cystic artery isolated. A large stone was impacted towards the neck of the gallbladder. A Kumar clamp was used to grasp the mid body of the gallbladder and the catheter was fed into the  gallbladder just above its junction with the cystic duct. Fluoroscopic cholangiograms were completed using 15 mL of one half strength Conray 60. This showed prompt filling of the right left hepatic ducts free flow into the pancreatic duct and into the duodenum. No evidence of retained stones. The cystic duct and cystic artery were doubly clipped and divided. The gallbladder removed from liver bed making use of hook cautery dissection. Due to the impacted stone it was placed into an Endo Catch bag  and delivered to the port site. The gallbladder was opened and "milk of calcium" bile was noted. the large stone was crushed and the gallbladder removed. It did require extension of the umbilical incision for a few millimeters to extract large stone fragments. After reestablishing pneumoperitoneum inspection of the right upper quadrant showed good hemostasis. This was irrigated with lactated Ringer solution. The abdomen was desufflated and ports removed under direct vision. Prior to desufflation inspection from the epigastric port site showed no evidence of injury from initial port placement. The fascia at the umbilicus was closed with a single 0 PDS figure-of-eight suture. Skin incisions were closed with 4-0 Vicryl subcuticular sutures. Benzoin, Steri-Strips, Telfa and Tegaderm dressings were applied.  The patient tolerated the procedure well and was taken to the recovery room in stable condition.

## 2016-03-04 NOTE — Transfer of Care (Signed)
Immediate Anesthesia Transfer of Care Note  Patient: Ashley RosebushStephanie L Moss  Procedure(s) Performed: Procedure(s): LAPAROSCOPIC CHOLECYSTECTOMY WITH INTRAOPERATIVE CHOLANGIOGRAM (N/A)  Patient Location: PACU  Anesthesia Type:General  Level of Consciousness: awake, alert , oriented and patient cooperative  Airway & Oxygen Therapy: Patient Spontanous Breathing and Patient connected to face mask oxygen  Post-op Assessment: Report given to RN, Post -op Vital signs reviewed and stable and Patient moving all extremities X 4  Post vital signs: Reviewed and stable  Last Vitals:  Filed Vitals:   03/04/16 0619 03/04/16 0643  BP: 138/111 139/90  Pulse:    Temp:    Resp:      Last Pain: There were no vitals filed for this visit.       Complications: No apparent anesthesia complications

## 2016-03-04 NOTE — Progress Notes (Signed)
Nausea  Phenergan 12.5mg  given   Pt states she doesn't feel good

## 2016-03-04 NOTE — Anesthesia Postprocedure Evaluation (Signed)
Anesthesia Post Note  Patient: Ashley Moss  Procedure(s) Performed: Procedure(s) (LRB): LAPAROSCOPIC CHOLECYSTECTOMY WITH INTRAOPERATIVE CHOLANGIOGRAM (N/A)  Patient location during evaluation: PACU Anesthesia Type: General Level of consciousness: awake and alert Pain management: pain level controlled Vital Signs Assessment: post-procedure vital signs reviewed and stable Respiratory status: spontaneous breathing, nonlabored ventilation, respiratory function stable and patient connected to nasal cannula oxygen Cardiovascular status: blood pressure returned to baseline and stable Postop Assessment: no signs of nausea or vomiting Anesthetic complications: no    Last Vitals:  Filed Vitals:   03/04/16 0925 03/04/16 0930  BP:  130/75  Pulse: 78 71  Temp: 36.8 C   Resp: 20 12    Last Pain:  Filed Vitals:   03/04/16 0930  PainSc: 4                  Jomarie LongsJoseph K Courtez Twaddle

## 2016-03-07 LAB — SURGICAL PATHOLOGY

## 2016-03-09 ENCOUNTER — Telehealth: Payer: Self-pay | Admitting: *Deleted

## 2016-03-09 NOTE — Telephone Encounter (Signed)
Patient had surgery on 03/04/16, gallbladder removal. She has an appointment on Thursday 03/11/16 and wanted to know if she needs to take her bandages off or wait until her appointment.

## 2016-03-09 NOTE — Telephone Encounter (Signed)
She prefers to leave bandages in place until appt.

## 2016-03-11 ENCOUNTER — Encounter: Payer: Self-pay | Admitting: General Surgery

## 2016-03-11 ENCOUNTER — Ambulatory Visit (INDEPENDENT_AMBULATORY_CARE_PROVIDER_SITE_OTHER): Payer: 59 | Admitting: General Surgery

## 2016-03-11 VITALS — BP 124/74 | HR 74 | Resp 12 | Ht 67.0 in | Wt 154.0 lb

## 2016-03-11 DIAGNOSIS — K8020 Calculus of gallbladder without cholecystitis without obstruction: Secondary | ICD-10-CM

## 2016-03-11 NOTE — Progress Notes (Signed)
Patient ID: Ashley Moss, female   DOB: 02/02/1971, 45 y.o.   MRN: [id number]  Chief Complaint  Patient presents with  . Routine Post Op    cholecystectomy    HPI Ashley Moss is a 45 y.o. female here today for her post op cholecystectomy done on 03/04/16. Patient states she is doing better, but reported significant fatigue and the initial days after the procedure. It is only in the last couple of days that she's been able to get up and move around. She stopped making use of narcotics 5 days ago. She still finds sitting uncomfortable, and is concerned that she may not be able to return to work as originally planned.  I personally reviewed the patient's history. dHPI  Past Medical History  Diagnosis Date  . Gallstones     Past Surgical History  Procedure Laterality Date  . Cesarean section  2001  . Cholecystectomy N/A 03/04/2016    Procedure: LAPAROSCOPIC CHOLECYSTECTOMY WITH INTRAOPERATIVE CHOLANGIOGRAM;  Surgeon: Earline MayotteJeffrey W Meryem Haertel, MD;  Location: [facility] ORS;  Service: General;  Laterality: N/A;    No family history on file.  Social History Social History  Substance Use Topics  . Smoking status: Never Smoker   . Smokeless tobacco: None  . Alcohol Use: 0.0 oz/week    0 Standard drinks or equivalent per week     Comment: soical    No Known Allergies  Current Outpatient Prescriptions  Medication Sig Dispense Refill  . BIOTIN PO Take 1 tablet by mouth every other day.    Marland Kitchen. HYDROcodone-acetaminophen (NORCO) 5-325 MG tablet Take 1-2 tablets by mouth every 4 (four) hours as needed for moderate pain. 30 tablet 0  . levonorgestrel-ethinyl estradiol (AVIANE,ALESSE,LESSINA) 0.1-20 MG-MCG tablet Take 1 tablet by mouth daily.     . Naproxen Sodium (ALEVE) 220 MG CAPS Take 1 capsule by mouth as needed.     No current facility-administered medications for this visit.    Review of Systems Review of Systems  Constitutional: Negative.   Respiratory: Negative.    Cardiovascular: Negative.     Blood pressure 124/74, pulse 74, resp. rate 12, height 5\' 7"  (1.702 m), weight 154 lb (69.854 kg), last menstrual period 01/30/2016.  Physical Exam Physical Exam  Constitutional: She is oriented to person, place, and time. She appears well-developed and well-nourished.  Cardiovascular: Normal rate, regular rhythm and normal heart sounds.   Pulmonary/Chest: Effort normal and breath sounds normal.  Abdominal: Soft. Normal appearance and bowel sounds are normal. There is no tenderness.  Port sites are clean and healing well.   Neurological: She is alert and oriented to person, place, and time.  Skin: Skin is warm and dry.    Data Reviewed The gallbladder showed a large stone impacted in the neck with normal cholangiograms obtained. Milk of calcium bile was noted. No evidence of malignancy.  Assessment    Slow recovery afte cholecystectomy without evidence of discrete postoperative complication.r     Plan    Patient is increase her activity as tolerated. Proper lifting technique reviewed.    Patient to return to work on 03/22/16. Patient as needed.  PCP:  Chestine Sporelark, K This information has been scribed by Ples SpecterJessica Qualls CMA.   Earline MayotteByrnett, Ioannis Schuh W 03/11/2016, 8:59 PM

## 2016-03-11 NOTE — Patient Instructions (Addendum)
Patient to return to work on 03/22/16. Patient to return as needed

## 2016-04-08 ENCOUNTER — Ambulatory Visit (INDEPENDENT_AMBULATORY_CARE_PROVIDER_SITE_OTHER): Payer: 59 | Admitting: *Deleted

## 2016-04-08 DIAGNOSIS — K8020 Calculus of gallbladder without cholecystitis without obstruction: Secondary | ICD-10-CM

## 2016-04-08 NOTE — Progress Notes (Signed)
Patient came in today for a wound check. She is post lap cholecystectomy on 03-04-16  She states she saw a white spot that she pulled but would not come off last weekend. The right lower port site <1 cm area of redness and is tender to touch. She will use neosporin and band aid with local heat to the area, pt agree. She will call back Monday with a status/progress report.

## 2016-04-08 NOTE — Patient Instructions (Signed)
The patient is aware to call back for any questions or concerns.  

## 2016-04-12 ENCOUNTER — Telehealth: Payer: Self-pay | Admitting: *Deleted

## 2016-04-12 NOTE — Telephone Encounter (Signed)
Patient called to give report on her incision site. She stated that it is not as red and no knot but on Saturday she thinks it may have opened up. There was a little blood and pus. This morning there is a little blood on band aide.

## 2016-04-13 NOTE — Telephone Encounter (Signed)
The patient should have the area assessed by the RN.

## 2016-04-14 ENCOUNTER — Ambulatory Visit (INDEPENDENT_AMBULATORY_CARE_PROVIDER_SITE_OTHER): Payer: 59 | Admitting: General Surgery

## 2016-04-14 DIAGNOSIS — T8189XA Other complications of procedures, not elsewhere classified, initial encounter: Secondary | ICD-10-CM | POA: Insufficient documentation

## 2016-04-14 DIAGNOSIS — K8020 Calculus of gallbladder without cholecystitis without obstruction: Secondary | ICD-10-CM

## 2016-04-14 HISTORY — DX: Other complications of procedures, not elsewhere classified, initial encounter: T81.89XA

## 2016-04-14 NOTE — Patient Instructions (Signed)
The patient is aware to call back for any questions or concerns.  

## 2016-04-14 NOTE — Progress Notes (Signed)
Patient ID: Ashley Moss, female   DOB: 06/24/1971, 45 y.o.   MRN: [id number]  Chief Complaint  Patient presents with  . Wound Check    HPI Ashley Moss is a 45 y.o. female.  Here today for wound check. She states she has noticed some irritation at the right lower port site. Minimal drainage this weekend.  HPI  Past Medical History:  Diagnosis Date  . Gallstones     Past Surgical History:  Procedure Laterality Date  . CESAREAN SECTION  2001  . CHOLECYSTECTOMY N/A 03/04/2016   Procedure: LAPAROSCOPIC CHOLECYSTECTOMY WITH INTRAOPERATIVE CHOLANGIOGRAM;  Surgeon: Earline MayotteJeffrey W Johngabriel Verde, MD;  Location: [facility] ORS;  Service: General;  Laterality: N/A;    No family history on file.  Social History Social History  Substance Use Topics  . Smoking status: Never Smoker  . Smokeless tobacco: Not on file  . Alcohol use 0.0 oz/week     Comment: soical    No Known Allergies  Current Outpatient Prescriptions  Medication Sig Dispense Refill  . BIOTIN PO Take 1 tablet by mouth every other day.    . levonorgestrel-ethinyl estradiol (AVIANE,ALESSE,LESSINA) 0.1-20 MG-MCG tablet Take 1 tablet by mouth daily.     . Naproxen Sodium (ALEVE) 220 MG CAPS Take 1 capsule by mouth as needed.     No current facility-administered medications for this visit.     Review of Systems Review of Systems  Constitutional: Negative.   Respiratory: Negative.   Cardiovascular: Negative.     There were no vitals taken for this visit.  Physical Exam Physical Exam  Constitutional: She is oriented to person, place, and time. She appears well-developed and well-nourished.  Abdominal:    Neurological: She is alert and oriented to person, place, and time.  Skin: Skin is warm and dry.  Psychiatric: Her behavior is normal.       Assessment    Wound drainage secondary to retained Vicryl suture.    Plan    The patient will keep the area covered until it heals completely. No additional  treatment required.      This information has been scribed by Dorathy DaftMarsha Hatch RN, BSN,BC.  Earline MayotteByrnett, Atara Paterson W 04/14/2016, 7:53 PM

## 2017-07-06 ENCOUNTER — Other Ambulatory Visit: Payer: Self-pay | Admitting: Certified Nurse Midwife

## 2017-07-06 ENCOUNTER — Telehealth: Payer: Self-pay | Admitting: Certified Nurse Midwife

## 2017-07-06 DIAGNOSIS — Z1231 Encounter for screening mammogram for malignant neoplasm of breast: Secondary | ICD-10-CM

## 2017-07-06 MED ORDER — LEVONORGESTREL-ETHINYL ESTRAD 0.1-20 MG-MCG PO TABS: 1.0000 | ORAL_TABLET | Freq: Every day | ORAL | 0 refills | Status: DC

## 2017-07-06 NOTE — Telephone Encounter (Signed)
Pt is calling to schedule appt. 07/26/17 for Annual. Pt is requesting a refill on birthcontrol

## 2017-07-06 NOTE — Telephone Encounter (Signed)
rx sent

## 2017-08-03 ENCOUNTER — Other Ambulatory Visit: Payer: Self-pay | Admitting: Certified Nurse Midwife

## 2017-08-25 ENCOUNTER — Ambulatory Visit: Payer: 59 | Admitting: Certified Nurse Midwife

## 2017-09-06 ENCOUNTER — Ambulatory Visit (INDEPENDENT_AMBULATORY_CARE_PROVIDER_SITE_OTHER): Payer: 59 | Admitting: Certified Nurse Midwife

## 2017-09-06 ENCOUNTER — Encounter: Payer: Self-pay | Admitting: Certified Nurse Midwife

## 2017-09-06 VITALS — BP 122/72 | HR 87 | Ht 66.0 in | Wt 131.0 lb

## 2017-09-06 DIAGNOSIS — Z124 Encounter for screening for malignant neoplasm of cervix: Secondary | ICD-10-CM | POA: Diagnosis not present

## 2017-09-06 DIAGNOSIS — R51 Headache: Secondary | ICD-10-CM

## 2017-09-06 DIAGNOSIS — Z01419 Encounter for gynecological examination (general) (routine) without abnormal findings: Secondary | ICD-10-CM

## 2017-09-06 DIAGNOSIS — R519 Headache, unspecified: Secondary | ICD-10-CM | POA: Insufficient documentation

## 2017-09-06 MED ORDER — LEVONORGESTREL-ETHINYL ESTRAD 0.1-20 MG-MCG PO TABS: 1.0000 | ORAL_TABLET | Freq: Every day | ORAL | 3 refills | Status: DC

## 2017-09-06 NOTE — Progress Notes (Signed)
Gynecology Annual Exam  PCP: Doreene Nest, NP  Chief Complaint:  Chief Complaint  Patient presents with  . Gynecologic Exam    History of Present Illness:Ashley Moss is a 47 year old Caucasian/White female, G1 P1001, who presents for her annual exam. She is having no significant GYN problems.  Her menses this past year have been monthly on her BCPs. They last 1-2 days and are light. Her LMP was 08/24/2017  She denies dysmenorrhea, but reports premenstrual breast tenderness, mood swings, irritability. The patient's past medical history is remarkable for preeclampsia, cholelithiasis (had cholecystectomy 2017) and ocular migraines. Since her last annual GYN exam dated 08/06/2016, she has lost 28# on [employer]. She has had infrequent headaches  She is sexually active. She is currently using birth control pills for contraception.  Her most recent pap smear was obtained 08/06/2016 and was negative.  Her most recent mammogram obtained on 04/24/2015 was normal. She has another mammogram scheduled for 09/13/2017 There is a positive history of breast cancer in her maternal great aunt. Genetic testing has not been done.  There is no family history of ovarian cancer.  The patient does do occ self breast exams.  The patient does not smoke.  The patient does drink infrequently.  The patient does not use illegal drugs.  The patient is walking her dog for exercise The patient does not get adequate calcium in her diet. She occasionally takes a calcium supplement.  She had a recent cholesterol screen in 2017 that was normal.     Review of Systems: Review of Systems  Constitutional: Positive for weight loss (intentional). Negative for chills and fever.  HENT: Negative for congestion, sinus pain and sore throat.   Eyes: Negative for blurred vision and pain.  Respiratory: Negative for hemoptysis, shortness of breath and wheezing.   Cardiovascular: Negative for chest pain,  palpitations and leg swelling.  Gastrointestinal: Negative for abdominal pain, blood in stool, diarrhea, heartburn, nausea and vomiting.  Genitourinary: Negative for dysuria, frequency, hematuria and urgency.  Musculoskeletal: Negative for back pain, joint pain and myalgias.  Skin: Negative for itching and rash.  Neurological: Negative for dizziness, tingling and headaches.  Endo/Heme/Allergies: Negative for environmental allergies and polydipsia. Does not bruise/bleed easily.       Negative for hirsutism   Psychiatric/Behavioral: Negative for depression. The patient is not nervous/anxious and does not have insomnia.   Breasts: premenstrual breast tenderness  Past Medical History:  Past Medical History:  Diagnosis Date  . Amenorrhea   . Gallstones   . Preeclampsia     Past Surgical History:  Past Surgical History:  Procedure Laterality Date  . CESAREAN SECTION  2001  . CHOLECYSTECTOMY N/A 03/04/2016   Procedure: LAPAROSCOPIC CHOLECYSTECTOMY WITH INTRAOPERATIVE CHOLANGIOGRAM;  Surgeon: Earline Mayotte, MD;  Location: [facility] ORS;  Service: General;  Laterality: N/A;    Family History:  Family History  Problem Relation Age of Onset  . Hyperlipidemia Mother        this may have resolved/2019  . Hypertension Mother   . Hypertension Maternal Grandmother   . Hypertension Maternal Grandfather   . Heart attack Maternal Grandfather   . Brain cancer Paternal Grandfather 13  . Breast cancer Other 60  . Other Father        Drowned in his 30s  . Hypertension Paternal Grandmother     Social History:  Social History   Socioeconomic History  . Marital status: Married    Spouse name:  Greg  . Number of children: 1  . Years of education: Not on file  . Highest education level: Not on file  Social Needs  . Financial resource strain: Not on file  . Food insecurity - worry: Not on file  . Food insecurity - inability: Not on file  . Transportation needs - medical: Not on file  .  Transportation needs - non-medical: Not on file  Occupational History  . Not on file  Tobacco Use  . Smoking status: Never Smoker  . Smokeless tobacco: Never Used  Substance and Sexual Activity  . Alcohol use: Yes    Alcohol/week: 0.0 oz    Comment: infrequent  . Drug use: No  . Sexual activity: Yes    Partners: Male    Birth control/protection: Pill  Other Topics Concern  . Not on file  Social History Narrative  . Not on file    Allergies:  No Known Allergies  Medications: Prior to Admission medications   Medication Sig Start Date End Date Taking? Authorizing Provider  AVIANE 0.1-20 MG-MCG tablet TAKE 1 TABLET BY MOUTH EVERY DAY 08/03/17   Farrel ConnersGutierrez, Mael Delap, CNM  BIOTIN PO Take 1 tablet by mouth every other day.    [provider]  Naproxen Sodium (ALEVE) 220 MG CAPS Take 1 capsule by mouth as needed.    [provider]    Physical Exam Vitals: BP 122/72   Pulse 87   Ht 5\' 6"  (1.676 m)   Wt 131 lb (59.4 kg)   LMP 08/24/2017 (Approximate)   BMI 21.14 kg/m   General: WF in NAD HEENT: normocephalic, anicteric Neck: no thyroid enlargement, no palpable nodules, no cervical lymphadenopathy  Pulmonary: No increased work of breathing, CTAB Cardiovascular: RRR, without murmur  Breast: Breast symmetrical, no tenderness, no palpable nodules or masses, no skin or nipple retraction present, no nipple discharge.  No axillary, infraclavicular or supraclavicular lymphadenopathy. Abdomen: Soft, non-tender, non-distended.  Umbilicus without lesions.  No hepatomegaly or masses palpable. No evidence of hernia. Genitourinary:  External: Normal external female genitalia.  Normal urethral meatus, normal Bartholin's and Skene's glands.    Vagina: Normal vaginal mucosa, no evidence of prolapse.    Cervix: Grossly normal in appearance, no bleeding, non-tender  Uterus: retroverted, normal size, shape, and consistency, mobile, and non-tender  Adnexa: No adnexal masses,  non-tender  Rectal: deferred  Lymphatic: no evidence of inguinal lymphadenopathy Extremities: no edema, erythema, or tenderness Neurologic: Grossly intact Psychiatric: mood appropriate, affect full     Assessment: 47 y.o. G1P1001 normal annual gyn exam  Plan:   1) Breast cancer screening - recommend monthly self breast exam and annual screening mammograms. Mammogram scheduled for next week.  2) Discussed calcium and vitamin D3 requirements and encouraged to take supplements since she does not get adequate amounts in her diet.  3) Cervical cancer screening - Pap was done. ASCCP guidelines and rational discussed.  Patient opts for yearly ?screening interval.  4) Contraception - Aviane #3 packs/ RF x 3 sent to [facility]  5) Routine healthcare maintenance including cholesterol and diabetes screening managed by PCP   6) RTO 1 year and prn  Farrel Connersolleen Darshawn Boateng, CNM

## 2017-09-08 LAB — IGP, APTIMA HPV
HPV Aptima: NEGATIVE
PAP Smear Comment: 0

## 2017-09-13 ENCOUNTER — Ambulatory Visit
Admission: RE | Admit: 2017-09-13 | Discharge: 2017-09-13 | Disposition: A | Discharge status: Home / Self Care | Payer: 59 | Source: Ambulatory Visit | Attending: Certified Nurse Midwife | Admitting: Certified Nurse Midwife

## 2017-09-13 DIAGNOSIS — Z1231 Encounter for screening mammogram for malignant neoplasm of breast: Secondary | ICD-10-CM | POA: Insufficient documentation

## 2017-09-16 ENCOUNTER — Inpatient Hospital Stay
Admission: RE | Admit: 2017-09-16 | Discharge: 2017-09-16 | Disposition: A | Discharge status: Home / Self Care | Payer: Self-pay | Source: Ambulatory Visit | Attending: *Deleted | Admitting: *Deleted

## 2017-09-16 ENCOUNTER — Other Ambulatory Visit: Payer: Self-pay | Admitting: *Deleted

## 2017-09-16 DIAGNOSIS — Z9289 Personal history of other medical treatment: Secondary | ICD-10-CM

## 2018-03-31 IMAGING — CT CT ABD-PELV W/ CM
1 of 3 series · 14 of 32 positions shown, 19 images · IV contrast (iopamidol)
Comparison: None.

CLINICAL DATA: Generalized periumbilical pain intermittent for 2
years. Abdominal bloating.

EXAM:
CT ABDOMEN AND PELVIS WITH CONTRAST
TECHNIQUE: Multidetector CT imaging of the abdomen and pelvis was performed
using the standard protocol following bolus administration of
intravenous contrast.
CONTRAST:  100mL [id number] IOPAMIDOL ([id number]) INJECTION 61%

[Series 2: axial st · axial · 0.68mm/px · z∈[-993,-578]mm · 14 of 93 slices shown, 19 images]
[im 5/93  soft-tissue]
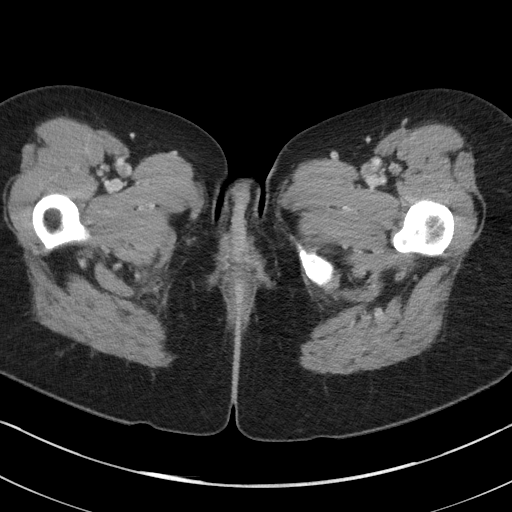
[im 5/93  bone]
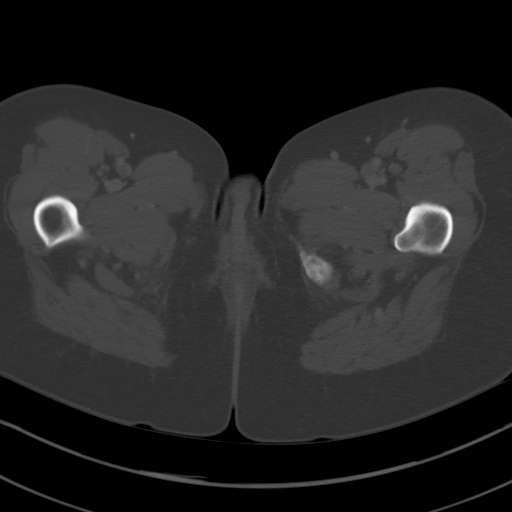
[im 15/93  soft-tissue]
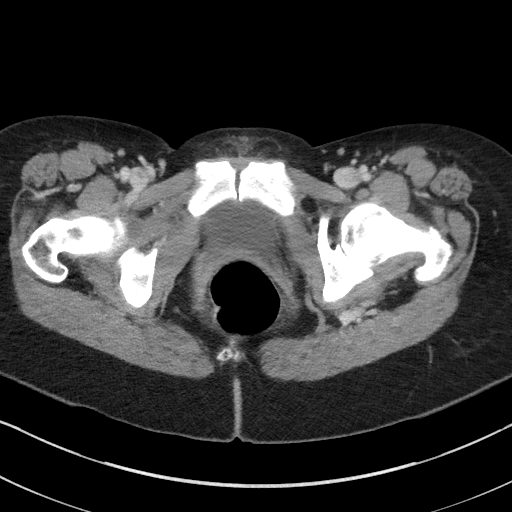
[im 20/93  soft-tissue]
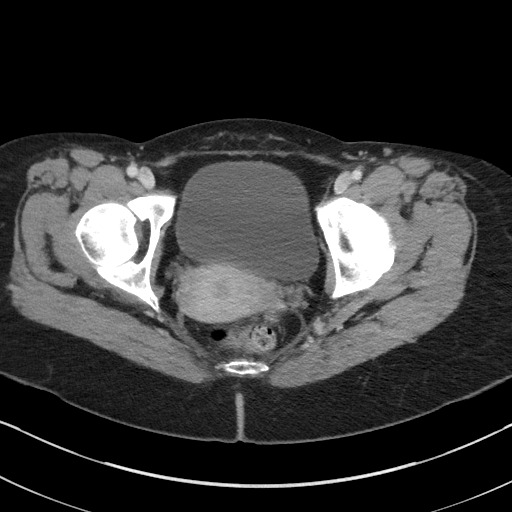
[im 25/93  soft-tissue]
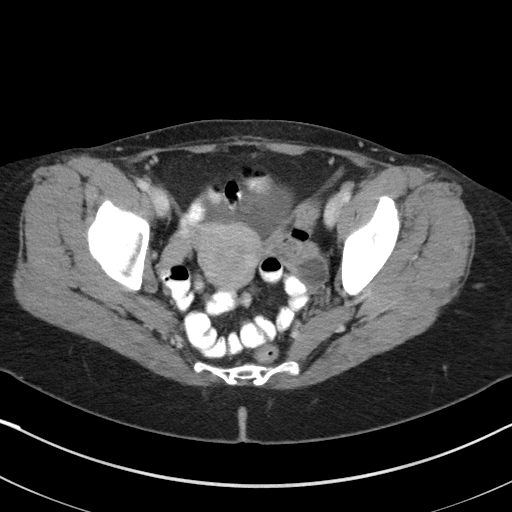
[im 34/93  soft-tissue]
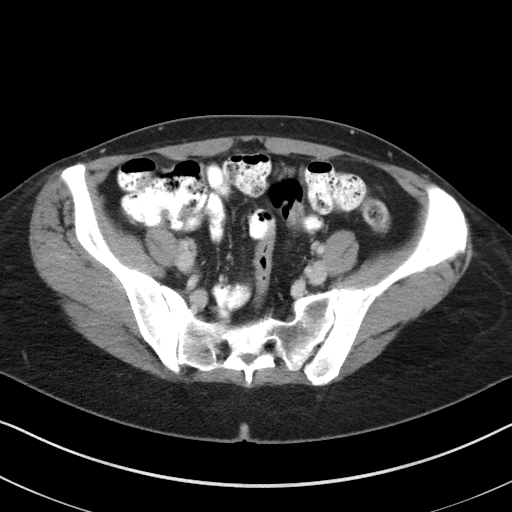
[im 39/93  soft-tissue]
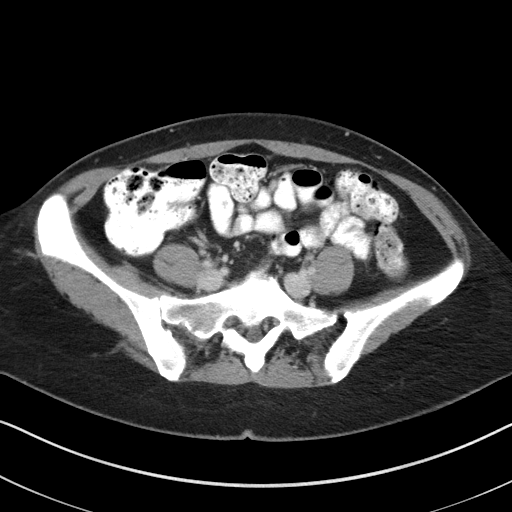
[im 49/93  soft-tissue]
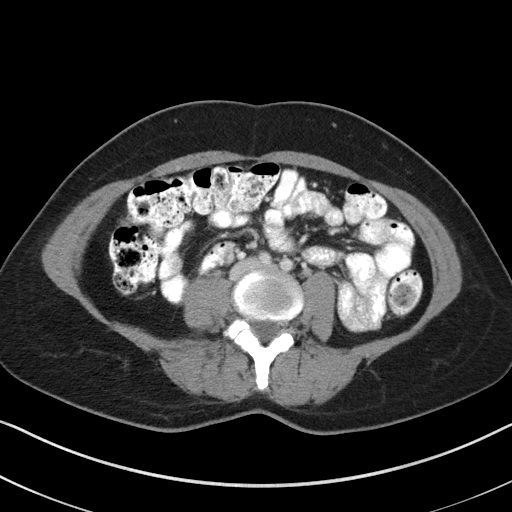
[im 54/93  soft-tissue]
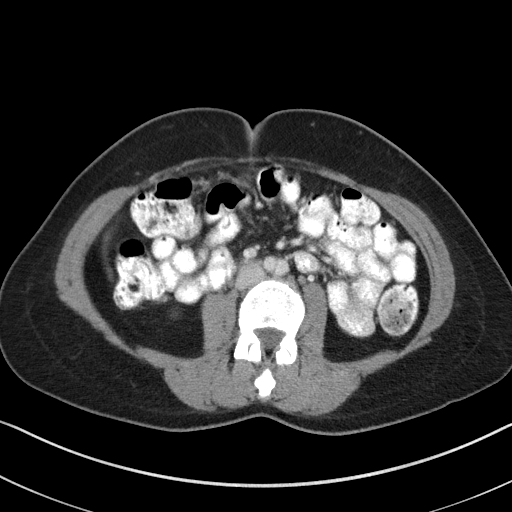
[im 59/93  soft-tissue]
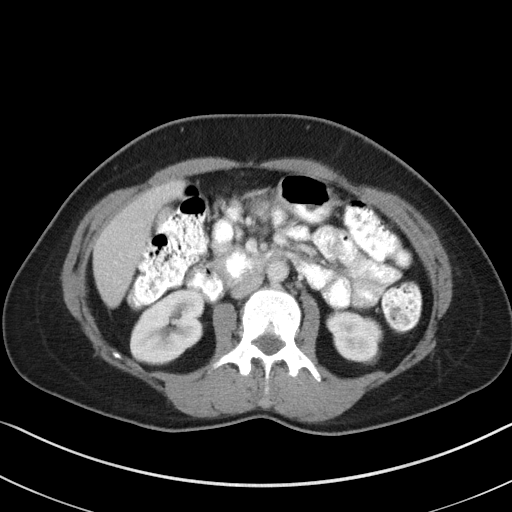
[im 59/93  bone]
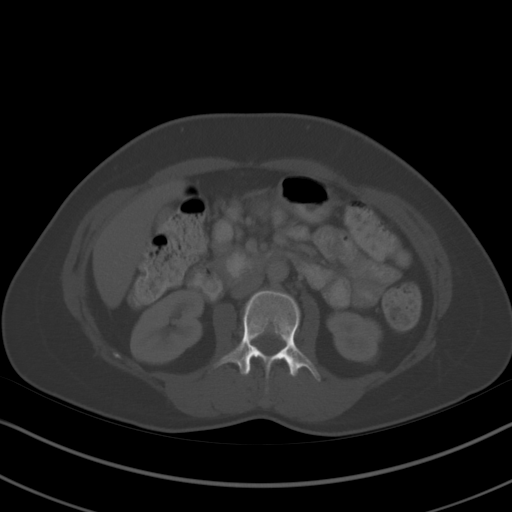
[im 68/93  soft-tissue]
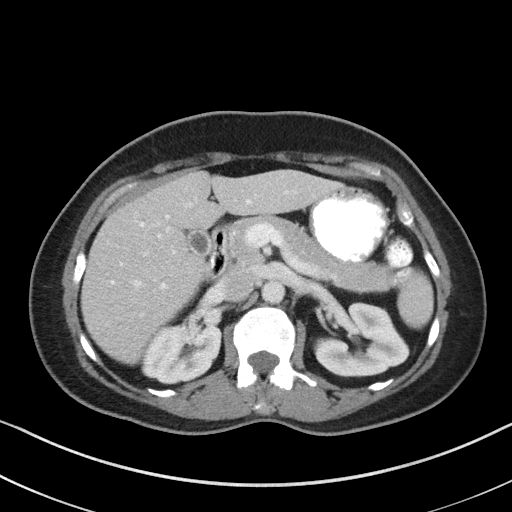
[im 73/93  soft-tissue]
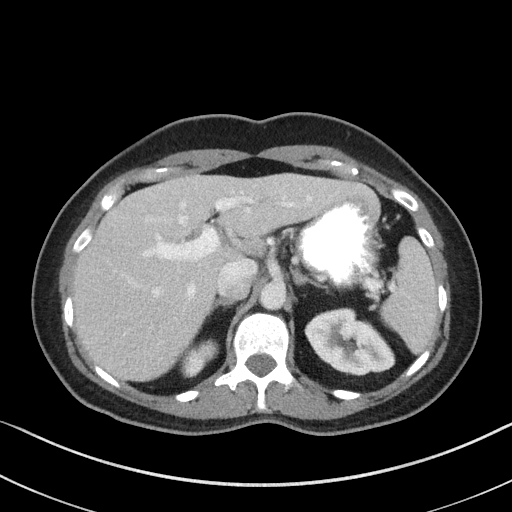
[im 73/93  lung]
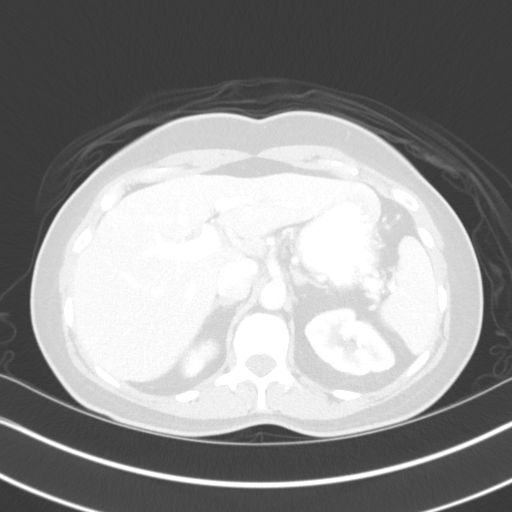
[im 78/93  soft-tissue]
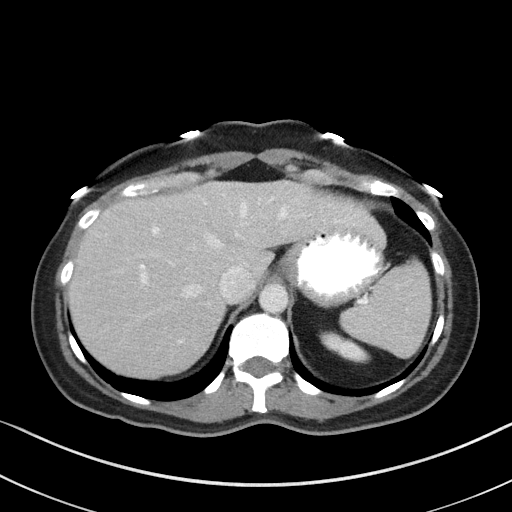
[im 78/93  lung]
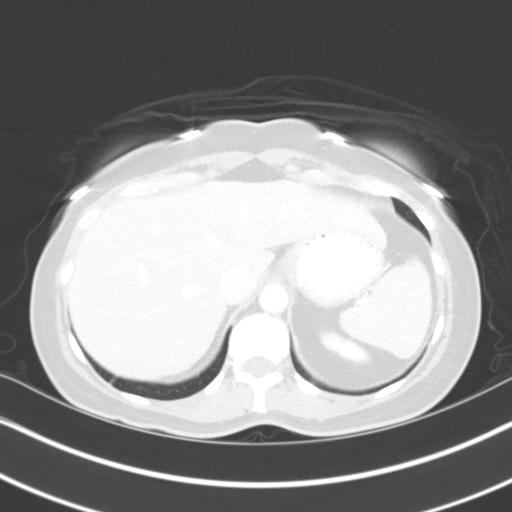
[im 83/93  lung]
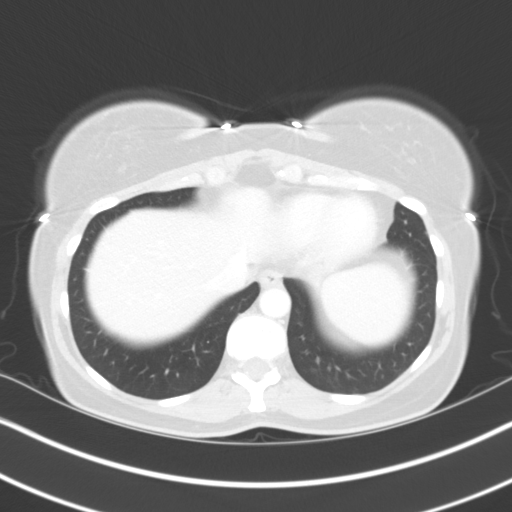
[im 88/93  soft-tissue]
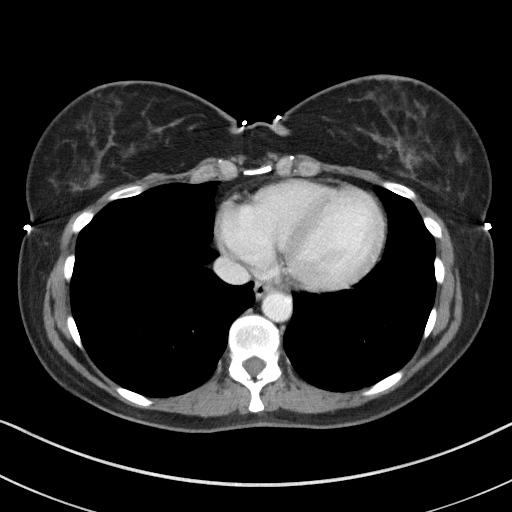
[im 88/93  lung]
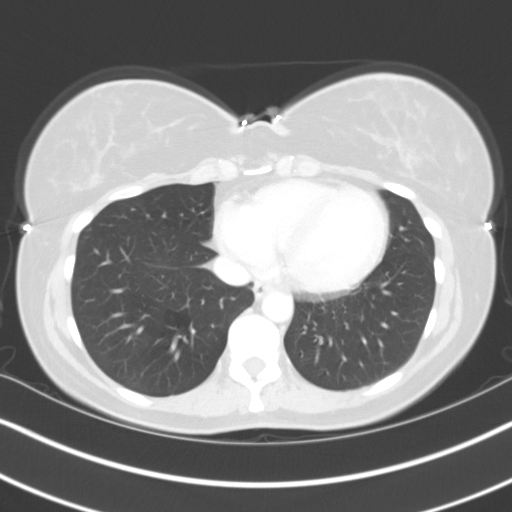

[14 of 32 positions shown; findings below may reference images not displayed]

FINDINGS: Lower chest: Lung bases are clear. No effusions. Heart is normal
size.

Hepatobiliary: No focal hepatic abnormality. Multiple gallstones
within the gallbladder.

Pancreas: No focal abnormality or ductal dilatation.

Spleen: No focal abnormality.  Normal size.

Adrenals/Urinary Tract: No adrenal abnormality. No focal renal
abnormality. No stones or hydronephrosis. Urinary bladder is
unremarkable.

Stomach/Bowel: Appendix is normal. Stomach, large and small bowel
grossly unremarkable.

Vascular/Lymphatic: No evidence of aneurysm or adenopathy.

Reproductive: Uterus and adnexa unremarkable.  No mass.

Other: No free fluid or free air.

Musculoskeletal: No acute bony abnormality or focal bone lesion.
IMPRESSION: Cholelithiasis.

No acute findings.

## 2018-06-19 ENCOUNTER — Other Ambulatory Visit: Payer: Self-pay | Admitting: Certified Nurse Midwife

## 2018-10-09 ENCOUNTER — Ambulatory Visit: Payer: Self-pay | Admitting: *Deleted

## 2018-10-09 NOTE — Telephone Encounter (Signed)
Noted  

## 2018-10-09 NOTE — Telephone Encounter (Signed)
Patient is calling with multiple symptoms that have been present for several months- patient is concerned and thinks it may be stress. Patient states she her hands turn white in cold, her feet turn bluish/purple in shower, hair thinning, left toe partially numb without injury. Patient scheduled for appointment per protocol.  Reason for Disposition . [1] Numbness or tingling in one or both feet AND [2] is a chronic symptom (recurrent or ongoing AND present > 4 weeks)  Answer Assessment - Initial Assessment Questions 1. SYMPTOM: "What is the main symptom you are concerned about?" (e.g., weakness, numbness)     Left toe- feet turning-bluish/purple in shower 2. ONSET: "When did this start?" (minutes, hours, days; while sleeping)     Several months 3. LAST NORMAL: "When was the last time you were normal (no symptoms)?"     Before Christmas 4. PATTERN "Does this come and go, or has it been constant since it started?"  "Is it present now?"     Constant- numbness in left toe(half of toe) 5. CARDIAC SYMPTOMS: "Have you had any of the following symptoms: chest pain, difficulty breathing, palpitations?"     no 6. NEUROLOGIC SYMPTOMS: "Have you had any of the following symptoms: headache, dizziness, vision loss, double vision, changes in speech, unsteady on your feet?"     no 7. OTHER SYMPTOMS: "Do you have any other symptoms?"     Hair thinning, fatigue 8. PREGNANCY: "Is there any chance you are pregnant?" "When was your last menstrual period?"     n/a  LMP- 3 weeks ago  Protocols used: NEUROLOGIC DEFICIT-A-AH

## 2018-10-10 ENCOUNTER — Ambulatory Visit: Payer: Managed Care, Other (non HMO) | Admitting: Primary Care

## 2018-10-10 ENCOUNTER — Encounter: Payer: Self-pay | Admitting: Primary Care

## 2018-10-10 VITALS — BP 130/82 | HR 87 | Temp 98.5°F | Ht 66.0 in | Wt 137.0 lb

## 2018-10-10 DIAGNOSIS — L659 Nonscarring hair loss, unspecified: Secondary | ICD-10-CM

## 2018-10-10 DIAGNOSIS — R200 Anesthesia of skin: Secondary | ICD-10-CM | POA: Diagnosis not present

## 2018-10-10 DIAGNOSIS — I7300 Raynaud's syndrome without gangrene: Secondary | ICD-10-CM

## 2018-10-10 DIAGNOSIS — R202 Paresthesia of skin: Secondary | ICD-10-CM

## 2018-10-10 NOTE — Assessment & Plan Note (Signed)
No balding or obvious rashes to scalp. Check TSH and other labs to rule out metabolic cause. This could be secondary to upcoming hormonal changes vs long term use of hair color. Discussed use of a multi-vitamin.

## 2018-10-10 NOTE — Assessment & Plan Note (Signed)
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  Discussed to wear more forgiving shoes. No abnormality seen on exam today. Check labs today including B12, CBC, BMP, A1C.

## 2018-10-10 NOTE — Addendum Note (Signed)
Addended by: Alvina Chou on: 10/10/2018 08:33 AM   Modules accepted: Orders

## 2018-10-10 NOTE — Assessment & Plan Note (Addendum)
Patient has pictures of her fingers during the acute episode that represent raynaud's phenomenon. Suspect this to be occurring to her feet as well.  Discussed to wear gloves during colder months, reduce prolonged exposure to cold.  Exam today unremarkable.

## 2018-10-10 NOTE — Progress Notes (Signed)
Subjective:    Patient ID: Ashley Moss, female    DOB: [birth year], 48 y.o.   MRN: [id number]  HPI  Ms. Terzian is a 48 year old female who presents today with multiple complaints.  1) Numbness/Skin Color Changes/Hair thinning:   Intermittent numbness with white color changes to her hands, mostly during cold weather but sometimes without regards. She will notice tingling with gradual return to the normal color of her skin. She has a picture with her today during an acute attack.  Numbness located to the tip of her left great toe that has been present around Fall/Winter in 2019. She mostly notices the numbness at night when sleeping, doesn't notice during the day when walking. She endorses wearing tight shoes chronically for years. She denies injury/trauma, color changes to the skin of the toe.  She's also noticed the skin of her plantar feet turn a bluish/purple color only when submerged in a warm bathtub. She denies pain/numbness to the bottom of her feet.  She's also noticed generalized hair thinning/breaking of her hair that she first noticed about one year ago, she gets her hair dyed regularly. Over the last several weeks she's noticed progressive thinning. She is not pulling clumps of hair out when showering or brushing her hair.   Thinks she may be under stress as her daughter went off to college. Her menstrual cycles are regular monthly, lasting three days on average. She denies hot flashes during the day, has noticed a few night sweats infrequently. She has noticed more cold sensation. She denies a family history of thyroid disorders.   Review of Systems  Respiratory: Negative for shortness of breath.   Cardiovascular: Negative for chest pain and palpitations.  Genitourinary: Negative for menstrual problem.  Skin: Positive for color change. Negative for wound.       Hair thinning       Past Medical History:  Diagnosis Date  . Amenorrhea   . Gallstones   .  Preeclampsia      Social History   Socioeconomic History  . Marital status: Married    Spouse name: Tammy Sours  . Number of children: 1  . Years of education: Not on file  . Highest education level: Not on file  Occupational History  . Not on file  Social Needs  . Financial resource strain: Not on file  . Food insecurity:    Worry: Not on file    Inability: Not on file  . Transportation needs:    Medical: Not on file    Non-medical: Not on file  Tobacco Use  . Smoking status: Never Smoker  . Smokeless tobacco: Never Used  Substance and Sexual Activity  . Alcohol use: Yes    Alcohol/week: 0.0 standard drinks    Comment: infrequent  . Drug use: No  . Sexual activity: Yes    Partners: Male    Birth control/protection: Pill  Lifestyle  . Physical activity:    Days per week: 3 days    Minutes per session: 30 min  . Stress: Not at all  Relationships  . Social connections:    Talks on phone: More than three times a week    Gets together: Once a week    Attends religious service: More than 4 times per year    Active member of club or organization: No    Attends meetings of clubs or organizations: Never    Relationship status: Married  . Intimate partner violence:    Fear  of current or ex partner: No    Emotionally abused: No    Physically abused: No    Forced sexual activity: No  Other Topics Concern  . Not on file  Social History Narrative  . Not on file    Past Surgical History:  Procedure Laterality Date  . CESAREAN SECTION  2001  . CHOLECYSTECTOMY N/A 03/04/2016   Procedure: LAPAROSCOPIC CHOLECYSTECTOMY WITH INTRAOPERATIVE CHOLANGIOGRAM;  Surgeon: Earline MayotteJeffrey W Byrnett, MD;  Location: [facility] ORS;  Service: General;  Laterality: N/A;    Family History  Problem Relation Age of Onset  . Hyperlipidemia Mother        this may have resolved/2019  . Hypertension Mother   . Hypertension Maternal Grandmother   . Hypertension Maternal Grandfather   . Heart attack Maternal  Grandfather   . Brain cancer Paternal Grandfather [id number]  . Breast cancer Other 60  . Other Father        Drowned in his 30s  . Hypertension Paternal Grandmother     No Known Allergies  Current Outpatient Medications on File Prior to Visit  Medication Sig Dispense Refill  . BIOTIN PO Take 1 tablet by mouth every other day.    Marland Kitchen. LARISSIA 0.1-20 MG-MCG tablet TAKE 1 TABLET BY MOUTH  DAILY 84 tablet 1   No current facility-administered medications on file prior to visit.     BP 130/82   Pulse 87   Temp 98.5 F (36.9 C) (Oral)   Ht 5\' 6"  (1.676 m)   Wt 137 lb (62.1 kg)   SpO2 99%   BMI 22.11 kg/m    Objective:   Physical Exam  Constitutional: She appears well-nourished.  Neck: Neck supple.  Cardiovascular: Normal rate and regular rhythm.  Pulses:      Dorsalis pedis pulses are 2+ on the right side and 2+ on the left side.       Posterior tibial pulses are 2+ on the right side and 2+ on the left side.  Respiratory: Effort normal and breath sounds normal.  Skin: Skin is warm and dry. No rash noted. No erythema.  No obvious balding or rashes to scalp. Left great toe unremarkable. Bilateral hands and plantar feet appear normal.   Psychiatric: She has a normal mood and affect.           Assessment & Plan:

## 2018-10-10 NOTE — Patient Instructions (Signed)
Stop by the lab prior to leaving today. I will notify you of your results once received.   Consider wearing shoes that are more loose.  Consider a multi-vitamin to help improve hair consistency.  It was a pleasure to see you today!   Raynaud Phenomenon  Raynaud phenomenon is a condition that affects the blood vessels (arteries) that carry blood to your fingers and toes. The arteries that supply blood to your ears, lips, nipples, or the tip of your nose might also be affected. Raynaud phenomenon causes the arteries to become narrow temporarily (spasm). As a result, the flow of blood to the affected areas is temporarily decreased. This usually occurs in response to cold temperatures or stress. During an attack, the skin in the affected areas turns white, then blue, and finally red. You may also feel tingling or numbness in those areas. Attacks usually last for only a brief period, and then the blood flow to the area returns to normal. In most cases, Raynaud phenomenon does not cause serious health problems. What are the causes? In many cases, the cause of this condition is not known. The condition may occur on its own (primary Raynaud phenomenon) or may be associated with other diseases or factors (secondary Raynaud phenomenon). Possible causes may include:  Diseases or medical conditions that damage the arteries.  Injuries and repetitive actions that hurt the hands or feet.  Being exposed to certain chemicals.  Taking medicines that narrow the arteries.  Other medical conditions, such as lupus, scleroderma, rheumatoid arthritis, thyroid problems, blood disorders, Sjogren syndrome, or atherosclerosis. What increases the risk? The following factors may make you more likely to develop this condition:  Being 17-78 years old.  Being female.  Having a family history of Raynaud phenomenon.  Living in a cold climate.  Smoking. What are the signs or symptoms? Symptoms of this condition  usually occur when you are exposed to cold temperatures or when you have emotional stress. The symptoms may last for a few minutes or up to several hours. They usually affect your fingers but may also affect your toes, nipples, lips, ears, or the tip of your nose. Symptoms may include:  Changes in skin color. The skin in the affected areas will turn pale or white. The skin may then change from white to bluish to red as normal blood flow returns to the area.  Numbness, tingling, or pain in the affected areas. In severe cases, symptoms may include:  Skin sores.  Tissues decaying and dying (gangrene). How is this diagnosed? This condition may be diagnosed based on:  Your symptoms and medical history.  A physical exam. During the exam, you may be asked to put your hands in cold water to check for a reaction to cold temperature.  Tests, such as: ? Blood tests to check for other diseases or conditions. ? A test to check the movement of blood through your arteries and veins (vascular ultrasound). ? A test in which the skin at the base of your fingernail is examined under a microscope (nailfold capillaroscopy). How is this treated? Treatment for this condition often involves making lifestyle changes and taking steps to control your exposure to cold temperatures. For more severe cases, medicine (calcium channel blockers) may be used to improve blood flow. Surgery is sometimes done to block the nerves that control the affected arteries, but this is rare. Follow these instructions at home: Avoiding cold temperatures Take these steps to avoid exposure to cold:  If possible, stay indoors  during cold weather.  When you go outside during cold weather, dress in layers and wear mittens, a hat, a scarf, and warm footwear.  Wear mittens or gloves when handling ice or frozen food.  Use holders for glasses or cans containing cold drinks.  Let warm water run for a while before taking a shower or  bath.  Warm up the car before driving in cold weather. Lifestyle   If possible, avoid stressful and emotional situations. Try to find ways to manage your stress, such as: ? Exercise. ? Yoga. ? Meditation. ? Biofeedback.  Do not use any products that contain nicotine or tobacco, such as cigarettes and e-cigarettes. If you need help quitting, ask your health care provider.  Avoid secondhand smoke.  Limit your use of caffeine. ? Switch to decaffeinated coffee, tea, and soda. ? Avoid chocolate.  Avoid vibrating tools and machinery. General instructions  Protect your hands and feet from injuries, cuts, or bruises.  Avoid wearing tight rings or wristbands.  Wear loose fitting socks and comfortable, roomy shoes.  Take over-the-counter and prescription medicines only as told by your health care provider. Contact a health care provider if:  Your discomfort becomes worse despite lifestyle changes.  You develop sores on your fingers or toes that do not heal.  Your fingers or toes turn black.  You have breaks in the skin on your fingers or toes.  You have a fever.  You have pain or swelling in your joints.  You have a rash.  Your symptoms occur on only one side of your body. Summary  Raynaud phenomenon is a condition that affects the arteries that carry blood to your fingers, toes, ears, lips, nipples, or the tip of your nose.  In many cases, the cause of this condition is not known.  Symptoms of this condition include changes in skin color, and numbness and tingling of the affected area.  Treatment for this condition includes lifestyle changes, reducing exposure to cold temperatures, and using medicines for severe cases of the condition.  Contact your health care provider if your condition worsens despite treatment. This information is not intended to replace advice given to you by your health care provider. Make sure you discuss any questions you have with your health  care provider. Document Released: 08/13/2000 Document Revised: 03/01/2017 Document Reviewed: 09/27/2016 Elsevier Interactive Patient Education  2019 [employer].

## 2018-10-11 LAB — COMPREHENSIVE METABOLIC PANEL
A/G RATIO: 2.1 (ref 1.2–2.2)
ALT: 8 IU/L (ref 0–32)
AST: 15 IU/L (ref 0–40)
Albumin: 4.5 g/dL (ref 3.8–4.8)
Alkaline Phosphatase: 40 IU/L (ref 39–117)
BUN/Creatinine Ratio: 14 (ref 9–23)
BUN: 9 mg/dL (ref 6–24)
Bilirubin Total: 0.4 mg/dL (ref 0.0–1.2)
CALCIUM: 9.4 mg/dL (ref 8.7–10.2)
CO2: 23 mmol/L (ref 20–29)
CREATININE: 0.64 mg/dL (ref 0.57–1.00)
Chloride: 105 mmol/L (ref 96–106)
GFR, EST AFRICAN AMERICAN: 123 mL/min/{1.73_m2} (ref >59)
GFR, EST NON AFRICAN AMERICAN: 107 mL/min/{1.73_m2} (ref >59)
Globulin, Total: 2.1 g/dL (ref 1.5–4.5)
Glucose: 108 mg/dL — ABNORMAL HIGH (ref 65–99)
Potassium: 4.8 mmol/L (ref 3.5–5.2)
Sodium: 139 mmol/L (ref 134–144)
TOTAL PROTEIN: 6.6 g/dL (ref 6.0–8.5)

## 2018-10-11 LAB — CBC
Hematocrit: 39 % (ref 34.0–46.6)
Hemoglobin: 13.5 g/dL (ref 11.1–15.9)
MCH: 29.9 pg (ref 26.6–33.0)
MCHC: 34.6 g/dL (ref 31.5–35.7)
MCV: 87 fL (ref 79–97)
PLATELETS: 207 10*3/uL (ref 150–450)
RBC: 4.51 x10E6/uL (ref 3.77–5.28)
RDW: 12.3 % (ref 11.7–15.4)
WBC: 7 10*3/uL (ref 3.4–10.8)

## 2018-10-11 LAB — VITAMIN B12: Vitamin B-12: 199 pg/mL — ABNORMAL LOW (ref 232–1245)

## 2018-10-11 LAB — T4, FREE: FREE T4: 1.27 ng/dL (ref 0.82–1.77)

## 2018-10-11 LAB — HEMOGLOBIN A1C
ESTIMATED AVERAGE GLUCOSE: 105 mg/dL
HEMOGLOBIN A1C: 5.3 % (ref 4.8–5.6)

## 2018-10-11 LAB — TSH: TSH: 1.79 u[IU]/mL (ref 0.450–4.500)

## 2018-11-24 ENCOUNTER — Other Ambulatory Visit: Payer: Self-pay | Admitting: Certified Nurse Midwife

## 2019-02-12 ENCOUNTER — Other Ambulatory Visit: Payer: Self-pay | Admitting: Certified Nurse Midwife

## 2019-02-12 ENCOUNTER — Other Ambulatory Visit: Payer: Self-pay | Admitting: Primary Care

## 2019-02-12 DIAGNOSIS — Z1231 Encounter for screening mammogram for malignant neoplasm of breast: Secondary | ICD-10-CM

## 2019-02-20 ENCOUNTER — Other Ambulatory Visit: Payer: Self-pay

## 2019-02-20 ENCOUNTER — Ambulatory Visit
Admission: RE | Admit: 2019-02-20 | Discharge: 2019-02-20 | Disposition: A | Discharge status: Home / Self Care | Payer: Managed Care, Other (non HMO) | Source: Ambulatory Visit | Attending: Primary Care | Admitting: Primary Care

## 2019-02-20 DIAGNOSIS — Z1231 Encounter for screening mammogram for malignant neoplasm of breast: Secondary | ICD-10-CM | POA: Insufficient documentation

## 2019-02-28 ENCOUNTER — Other Ambulatory Visit: Payer: Self-pay | Admitting: Certified Nurse Midwife

## 2019-04-27 ENCOUNTER — Other Ambulatory Visit: Payer: Self-pay

## 2019-04-27 ENCOUNTER — Telehealth: Payer: Self-pay

## 2019-04-27 MED ORDER — LEVONORGESTREL-ETHINYL ESTRAD 0.1-20 MG-MCG PO TABS: 1.0000 | ORAL_TABLET | Freq: Every day | ORAL | 0 refills | Status: DC

## 2019-04-27 NOTE — Telephone Encounter (Signed)
Pt called triage needing a refill

## 2019-05-09 ENCOUNTER — Encounter: Payer: Self-pay | Admitting: Certified Nurse Midwife

## 2019-05-09 ENCOUNTER — Ambulatory Visit (INDEPENDENT_AMBULATORY_CARE_PROVIDER_SITE_OTHER): Payer: Managed Care, Other (non HMO) | Admitting: Certified Nurse Midwife

## 2019-05-09 ENCOUNTER — Other Ambulatory Visit: Payer: Self-pay

## 2019-05-09 VITALS — BP 144/90 | HR 78 | Ht 66.0 in | Wt 145.0 lb

## 2019-05-09 DIAGNOSIS — Z01419 Encounter for gynecological examination (general) (routine) without abnormal findings: Secondary | ICD-10-CM | POA: Diagnosis not present

## 2019-05-09 DIAGNOSIS — Z124 Encounter for screening for malignant neoplasm of cervix: Secondary | ICD-10-CM

## 2019-05-09 DIAGNOSIS — Z3041 Encounter for surveillance of contraceptive pills: Secondary | ICD-10-CM | POA: Diagnosis not present

## 2019-05-09 MED ORDER — LEVONORGESTREL-ETHINYL ESTRAD 0.1-20 MG-MCG PO TABS: 1.0000 | ORAL_TABLET | Freq: Every day | ORAL | 3 refills | Status: DC

## 2019-05-09 NOTE — Progress Notes (Signed)
Gynecology Annual Exam  PCP: Doreene Nest, NP  Chief Complaint:  Chief Complaint  Patient presents with  . Gynecologic Exam    rx to mail in pharm.    History of Present Illness:Ashley Moss is a 48 year old Caucasian/White female, G1 P1001, who presents for her annual exam. She is having no significant GYN problems.  Her menses have been monthly on her BCPs. They last 3 days and are light. Her LMP was 05/02/2019  She denies dysmenorrhea, but reports premenstrual mood swings, irritability. Has  The patient's past medical history is remarkable for preeclampsia, cholelithiasis (had cholecystectomy 2017) and ocular migraines. Since her last annual GYN exam dated 09/06/2017, she has gained  back the weight that she lost at her last annual. She has been working from home during [employer].  She is sexually active. She is currently using birth control pills for contraception.  Her most recent pap smear was obtained 09/06/2017 and was NIL/negative HRHPV.  Her most recent mammogram obtained on 02/21/2019 was normal.  There is a positive history of breast cancer in her maternal great aunt. Genetic testing has not been done.  There is no family history of ovarian cancer.  The patient does do occ self breast exams.  The patient does not smoke.  The patient does drink infrequently.  The patient does not use illegal drugs.  The patient is walking her dog for exercise The patient does get adequate calcium in her diet.  She had a recent cholesterol screen in 2017 that was normal.     Review of Systems: Review of Systems  Constitutional: Negative for chills and fever.  HENT: Negative for congestion, sinus pain and sore throat.   Eyes: Negative for blurred vision and pain.  Respiratory: Negative for hemoptysis, shortness of breath and wheezing.   Cardiovascular: Negative for chest pain, palpitations and leg swelling.  Gastrointestinal: Negative for abdominal pain, blood in stool,  diarrhea, heartburn, nausea and vomiting.  Genitourinary: Negative for dysuria, frequency, hematuria and urgency.  Musculoskeletal: Negative for back pain, joint pain and myalgias.  Skin: Negative for itching and rash.  Neurological: Negative for dizziness, tingling and headaches.  Endo/Heme/Allergies: Negative for environmental allergies and polydipsia. Does not bruise/bleed easily.       Negative for hirsutism   Psychiatric/Behavioral: Negative for depression. The patient is not nervous/anxious and does not have insomnia.   Breasts: negative for masses  Past Medical History:  Past Medical History:  Diagnosis Date  . Amenorrhea   . Gallstones   . Preeclampsia     Past Surgical History:  Past Surgical History:  Procedure Laterality Date  . CESAREAN SECTION  2001  . CHOLECYSTECTOMY N/A 03/04/2016   Procedure: LAPAROSCOPIC CHOLECYSTECTOMY WITH INTRAOPERATIVE CHOLANGIOGRAM;  Surgeon: Earline Mayotte, MD;  Location: [facility] ORS;  Service: General;  Laterality: N/A;    Family History:  Family History  Problem Relation Age of Onset  . Hyperlipidemia Mother        this may have resolved/2019  . Hypertension Mother   . Hypertension Maternal Grandmother   . Hypertension Maternal Grandfather   . Heart attack Maternal Grandfather   . Brain cancer Paternal Grandfather 73  . Breast cancer Other 60  . Other Father        Drowned in his 30s  . Hypertension Paternal Grandmother     Social History:  Social History   Socioeconomic History  . Marital status: Married    Spouse name: Tammy Sours  .  Number of children: 1  . Years of education: Not on file  . Highest education level: Not on file  Occupational History  . Not on file  Social Needs  . Financial resource strain: Not on file  . Food insecurity    Worry: Not on file    Inability: Not on file  . Transportation needs    Medical: Not on file    Non-medical: Not on file  Tobacco Use  . Smoking status: Never Smoker  . Smokeless  tobacco: Never Used  Substance and Sexual Activity  . Alcohol use: Yes    Alcohol/week: 0.0 standard drinks    Comment: infrequent  . Drug use: No  . Sexual activity: Yes    Partners: Male    Birth control/protection: Pill  Lifestyle  . Physical activity    Days per week: 3 days    Minutes per session: 30 min  . Stress: Not at all  Relationships  . Social connections    Talks on phone: More than three times a week    Gets together: Once a week    Attends religious service: More than 4 times per year    Active member of club or organization: No    Attends meetings of clubs or organizations: Never    Relationship status: Married  . Intimate partner violence    Fear of current or ex partner: No    Emotionally abused: No    Physically abused: No    Forced sexual activity: No  Other Topics Concern  . Not on file  Social History Narrative  . Not on file    Allergies:  No Known Allergies  Medications: [place] OCPs Current Outpatient Medications on File Prior to Visit  Medication Sig Dispense Refill  . BIOTIN PO Take 1 tablet by mouth every other day.    . vitamin B-12 (CYANOCOBALAMIN) 1000 MCG tablet Take 1,000 mcg by mouth daily.     No current facility-administered medications on file prior to visit.    Collagen      Physical Exam Vitals: BP 140/90   Pulse 78   Ht 5\' 6"  (1.676 m)   Wt 145 lb (65.8 kg)   LMP 05/02/2019 (Exact Date)   BMI 23.40 kg/m   General: WF in NAD HEENT: normocephalic, anicteric Neck: no thyroid enlargement, no palpable nodules, no cervical lymphadenopathy  Pulmonary: No increased work of breathing, CTAB Cardiovascular: RRR, without murmur  Breast: Breast symmetrical, no tenderness, no palpable nodules or masses, no skin or nipple retraction present, no nipple discharge.  No axillary, infraclavicular or supraclavicular lymphadenopathy. Abdomen: Soft, non-tender, non-distended.  Umbilicus without lesions.  No hepatomegaly or masses  palpable. No evidence of hernia. Genitourinary:  External: Normal external female genitalia.  Normal urethral meatus, normal Bartholin's and Skene's glands.    Vagina: Normal vaginal mucosa, no evidence of prolapse.    Cervix: Grossly normal in appearance, no bleeding, non-tender  Uterus: size, shape, and consistency, mobile, and non-tender  Adnexa: No adnexal masses, non-tender  Rectal: deferred  Lymphatic: no evidence of inguinal lymphadenopathy Extremities: no edema, erythema, or tenderness Neurologic: Grossly intact Psychiatric: mood appropriate, affect full     Assessment: 48 y.o. G1P1001 normal annual gyn exam Elevated blood pressure  Plan:   1) Breast cancer screening - recommend monthly self breast exam and continued annual screening mammograms. Mammogram UTD  2) Colon cancer screening: discussed current recommendations to begin screening for colon cancer at age 68 as well as options for colon  cancer screening. Desires referral for colonoscopy next year.  3) Cervical cancer screening - Pap was done. ASCCP guidelines and rational discussed.  Patient opts for every 2-3 years screening interval  4) Contraception - [place] #3 packs/ RF x 3 sent to [facility]  5) Routine healthcare maintenance including cholesterol and diabetes screening managed by PCP   6) RTO 1 month for blood pressure check  Farrel Connersolleen Nikira Kushnir, CNM

## 2019-05-11 LAB — IGP,RFX APTIMA HPV ALL PTH

## 2019-05-22 ENCOUNTER — Other Ambulatory Visit: Payer: Self-pay | Admitting: Certified Nurse Midwife

## 2019-06-07 ENCOUNTER — Encounter: Payer: Self-pay | Admitting: Certified Nurse Midwife

## 2019-06-07 ENCOUNTER — Other Ambulatory Visit: Payer: Self-pay

## 2019-06-07 ENCOUNTER — Ambulatory Visit (INDEPENDENT_AMBULATORY_CARE_PROVIDER_SITE_OTHER): Payer: Managed Care, Other (non HMO) | Admitting: Certified Nurse Midwife

## 2019-06-07 VITALS — BP 140/84 | HR 85 | Ht 66.0 in | Wt 145.0 lb

## 2019-06-07 DIAGNOSIS — Z3041 Encounter for surveillance of contraceptive pills: Secondary | ICD-10-CM | POA: Diagnosis not present

## 2019-06-07 DIAGNOSIS — R030 Elevated blood-pressure reading, without diagnosis of hypertension: Secondary | ICD-10-CM

## 2019-06-10 NOTE — Progress Notes (Signed)
  History of Present Illness:  Ashley Moss is a 48 y.o. who presents for a blood pressure check. At the time of her annual exam one month ago, her BP was 144/90. She is currently taking [place] OCPs ( generic for Alesse). Has no symptoms of headache, blurred vision, chest pain. Has gained 14# in the last year.   PMHx: She  has a past medical history of Amenorrhea, Gallstones, and Preeclampsia. Also,  has a past surgical history that includes Cesarean section (2001) and Cholecystectomy (N/A, 03/04/2016)., family history includes Brain cancer (age of onset: 68) in her paternal grandfather; Breast cancer (age of onset: 72) in an other family member; Heart attack in her maternal grandfather; Hyperlipidemia in her mother; Hypertension in her maternal grandfather, maternal grandmother, mother, and paternal grandmother; Other in her father.,  reports that she has never smoked. She has never used smokeless tobacco. She reports current alcohol use. She reports that she does not use drugs.  She has a current medication list which includes the following prescription(s): biotin, levonorgestrel-ethinyl estradiol, and vitamin b-12. Also, has No Known Allergies.  ROS  Physical Exam:  BP 140/84   Pulse 85   Ht 5\' 6"  (1.676 m)   Wt 145 lb (65.8 kg)   LMP 05/30/2019   BMI 23.40 kg/m  Body mass index is 23.4 kg/m. Constitutional: Well nourished, well developed female in no acute distress.  Abdomen: diffusely non tender to palpation, non distended, and no masses, hernias Neuro: Grossly intact Psych:  Normal mood and affect.    Assessment: Mild blood pressure elevation on OCPs  Plan: Will change her OCPs to LoLoestrin (given samples) to lower estrogen level and have her return in 6-8 weeks for blood pressure check Encouraged weight loss and reduction of salt intake (usually does not salt) She was amenable to this plan and we will see her back for follow up  Dalia Heading, [facility],  [place]

## 2019-06-11 MED ORDER — LO LOESTRIN FE 1 MG-10 MCG / 10 MCG PO TABS: 1.0000 | ORAL_TABLET | Freq: Every day | ORAL | 0 refills | Status: DC

## 2019-08-09 ENCOUNTER — Ambulatory Visit: Payer: Managed Care, Other (non HMO) | Admitting: Certified Nurse Midwife

## 2019-08-10 ENCOUNTER — Other Ambulatory Visit: Payer: Self-pay

## 2019-08-10 ENCOUNTER — Encounter: Payer: Self-pay | Admitting: Certified Nurse Midwife

## 2019-08-10 ENCOUNTER — Ambulatory Visit (INDEPENDENT_AMBULATORY_CARE_PROVIDER_SITE_OTHER): Payer: Managed Care, Other (non HMO) | Admitting: Certified Nurse Midwife

## 2019-08-10 VITALS — BP 140/80 | HR 84 | Temp 97.3°F | Ht 66.0 in | Wt 150.0 lb

## 2019-08-10 DIAGNOSIS — R030 Elevated blood-pressure reading, without diagnosis of hypertension: Secondary | ICD-10-CM | POA: Diagnosis not present

## 2019-08-10 MED ORDER — LO LOESTRIN FE 1 MG-10 MCG / 10 MCG PO TABS: 1.0000 | ORAL_TABLET | Freq: Every day | ORAL | 3 refills | Status: DC

## 2019-08-10 NOTE — Progress Notes (Addendum)
  History of Present Illness:  Ashley Moss is a 48 y.o. who presents for a blood pressure check. At the time of her annual exam three  months ago, her BP was 144/90. She was taking [place] OCPs ( generic for Alesse) at that time. Her blood pressure check a month later was still elevated so her BCP was changed to [employer]. She took the [employer] for 2 months then resumed taking the [place] when she ran out of LoLoestrin about a week ago.  Has had some headaches recently, mostly around the orbits and ethmoid areas, which she is blaming on weather changes and sinuses. She also is due for an eye exam. She has gained another 5# since her last visit. She denies any BTB on the LoLoestrin.    PMHx: She  has a past medical history of Amenorrhea, Gallstones, and Preeclampsia. Also,  has a past surgical history that includes Cesarean section (2001) and Cholecystectomy (N/A, 03/04/2016)., family history includes Brain cancer (age of onset: 21) in her paternal grandfather; Breast cancer (age of onset: 43) in an other family member; Heart attack in her maternal grandfather; Hyperlipidemia in her mother; Hypertension in her maternal grandfather, maternal grandmother, mother, and paternal grandmother; Other in her father.,  reports that she has never smoked. She has never used smokeless tobacco. She reports current alcohol use. She reports that she does not use drugs.  She has a current medication list which includes the following prescription(s): vitamin b-12, biotin, and lo loestrin fe. Also, has No Known Allergies.  ROS-see HPI  Physical Exam:  BP 140/80   Pulse 84   Temp (!) 97.3 F (36.3 C)   Ht 5\' 6"  (1.676 m)   Wt 150 lb (68 kg)   LMP 07/26/2019 (Approximate)   BMI 24.21 kg/m  Body mass index is 24.21 kg/m. Constitutional: Well nourished, well developed female in no acute distress.   Neuro: Grossly intact Psych:  Normal mood and affect.    Assessment: Mild blood pressure elevation on  OCPs  Plan: Will have patient try Slynd and have her return in 3 months for blood pressure check Encouraged weight loss and reduction of salt intake (usually does not salt) She was amenable to this plan and we will see her back for follow up  Dalia Heading, [facility], [place]

## 2019-11-07 ENCOUNTER — Telehealth: Payer: Self-pay

## 2019-11-07 NOTE — Telephone Encounter (Signed)
Pt calling; BP ck was scheduled for this Fri; pt unable to come d/t covid exposure; has rescheduled; was to also have a f/u on new bcp Fri as well; will run out Sat; go back to the old pills, have someone p/u sample for her, or what to do?  [phone]  Pt states new bcp is Slynd.  Pt aware CLG on call today and back in the office on Fri.

## 2019-11-09 ENCOUNTER — Ambulatory Visit: Payer: Managed Care, Other (non HMO) | Admitting: Certified Nurse Midwife

## 2019-11-09 NOTE — Telephone Encounter (Signed)
Talked with patient-has tested positive for Covid, needs another pack of Slynd to get her to her next appointment. Sample pack left at front desk. Will see if neighbor can come pick it up. Farrel Conners, CNM

## 2019-11-28 ENCOUNTER — Encounter: Payer: Self-pay | Admitting: Certified Nurse Midwife

## 2019-11-28 ENCOUNTER — Ambulatory Visit (INDEPENDENT_AMBULATORY_CARE_PROVIDER_SITE_OTHER): Payer: Managed Care, Other (non HMO) | Admitting: Certified Nurse Midwife

## 2019-11-28 ENCOUNTER — Other Ambulatory Visit: Payer: Self-pay

## 2019-11-28 VITALS — BP 150/78 | HR 96 | Ht 66.0 in | Wt 148.0 lb

## 2019-11-28 DIAGNOSIS — R030 Elevated blood-pressure reading, without diagnosis of hypertension: Secondary | ICD-10-CM | POA: Diagnosis not present

## 2019-11-28 DIAGNOSIS — Z0131 Encounter for examination of blood pressure with abnormal findings: Secondary | ICD-10-CM | POA: Diagnosis not present

## 2019-11-28 MED ORDER — SLYND 4 MG PO TABS: 1.0000 | ORAL_TABLET | Freq: Every day | ORAL | 1 refills | Status: DC

## 2019-11-28 MED ORDER — ZOLPIDEM TARTRATE ER 6.25 MG PO TBCR: 6.2500 mg | EXTENDED_RELEASE_TABLET | Freq: Every evening | ORAL | 0 refills | Status: DC | PRN

## 2019-12-01 NOTE — Progress Notes (Signed)
  History of Present Illness:  Ashley Moss is a 49 y.o. who presents for a blood pressure check. At the time of her annual exam six months ago, her BP was 144/90. She was taking [place] OCPs ( generic for Alesse) at that time. Her blood pressure check a month later was still elevated so her BCP was changed to [employer]. She took the [employer] for 2 months then resumed taking the [place] when she ran out of [employer]. Her blood pressure recheck was 140/80.   Her birth control was then changed to a progestin only pill-Slynd.  Her blood pressure remains elevated, however, she reports that she had Covid 19 about three weeks ago and is still dealing with fatigue and difficulty staying asleep since that infection. Has tried Tylenol PM and essential oils and melatonin without success. She is requesting something to help her get some sleep.  PMHx: She  has a past medical history of Amenorrhea, Gallstones, and Preeclampsia. Also,  has a past surgical history that includes Cesarean section (2001) and Cholecystectomy (N/A, 03/04/2016)., family history includes Brain cancer (age of onset: 77) in her paternal grandfather; Breast cancer (age of onset: 48) in an other family member; Heart attack in her maternal grandfather; Hyperlipidemia in her mother; Hypertension in her maternal grandfather, maternal grandmother, mother, and paternal grandmother; Other in her father.,  reports that she has never smoked. She has never used smokeless tobacco. She reports current alcohol use. She reports that she does not use drugs.  She has a current medication list which includes the following prescription(s): vitamin c, cholecalciferol, larissia, vitamin b-12, zinc gluconate, biotin, slynd Also, has No Known Allergies.  ROS-see HPI  Physical Exam:  BP (!) 150/78   Pulse 96   Ht 5\' 6"  (1.676 m)   Wt 148 lb (67.1 kg)   LMP 07/26/2019 (Approximate)   BMI 23.89 kg/m  Body mass index is 23.89 kg/m. Constitutional: Well  nourished, well developed female in no acute distress.   Neuro: Grossly intact Psych:  Normal mood and affect.    Assessment: Mild blood pressure elevation on Slynd  Plan: Will have patient continue on Slynd and have her return in 4-6 weeks for blood pressure check Ambien 5 mgm po at hs prn If blood pressure remains elevated after recovering from Covid,  will refer to PCP for treatment  07/28/2019, CNM [facility], [facility]

## 2019-12-12 ENCOUNTER — Telehealth: Payer: Self-pay

## 2019-12-12 ENCOUNTER — Other Ambulatory Visit: Payer: Self-pay | Admitting: Certified Nurse Midwife

## 2019-12-12 MED ORDER — NORETHINDRONE 0.35 MG PO TABS: 1.0000 | ORAL_TABLET | Freq: Every day | ORAL | 3 refills | Status: DC

## 2019-12-12 NOTE — Telephone Encounter (Signed)
Pt called triage, CLG sent in [facility] and it is 700 dollars not covered by insurance, can she send in something else.

## 2019-12-12 NOTE — Telephone Encounter (Signed)
Patient called. Slynd too expensive. Changed RX to norethindrone and sent that to [facility] pharmacy. Farrel Conners, CNM

## 2020-01-10 ENCOUNTER — Ambulatory Visit (INDEPENDENT_AMBULATORY_CARE_PROVIDER_SITE_OTHER): Payer: Managed Care, Other (non HMO) | Admitting: Certified Nurse Midwife

## 2020-01-10 ENCOUNTER — Other Ambulatory Visit: Payer: Self-pay

## 2020-01-10 VITALS — BP 128/80 | Ht 65.0 in

## 2020-01-10 DIAGNOSIS — N9489 Other specified conditions associated with female genital organs and menstrual cycle: Secondary | ICD-10-CM | POA: Diagnosis not present

## 2020-01-10 DIAGNOSIS — R300 Dysuria: Secondary | ICD-10-CM | POA: Diagnosis not present

## 2020-01-10 DIAGNOSIS — Z3041 Encounter for surveillance of contraceptive pills: Secondary | ICD-10-CM | POA: Diagnosis not present

## 2020-01-10 DIAGNOSIS — N9089 Other specified noninflammatory disorders of vulva and perineum: Secondary | ICD-10-CM | POA: Diagnosis not present

## 2020-01-10 LAB — POCT URINALYSIS DIPSTICK
Bilirubin, UA: NEGATIVE
Blood, UA: NEGATIVE
Glucose, UA: NEGATIVE
Ketones, UA: NEGATIVE
Leukocytes, UA: NEGATIVE
Nitrite, UA: NEGATIVE
Protein, UA: NEGATIVE
Spec Grav, UA: 1.025 (ref 1.010–1.025)
Urobilinogen, UA: NEGATIVE E.U./dL — AB
pH, UA: 6.5 (ref 5.0–8.0)

## 2020-01-10 MED ORDER — NYSTATIN 100000 UNIT/GM EX OINT: 1.0000 "application " | TOPICAL_OINTMENT | Freq: Two times a day (BID) | CUTANEOUS | 0 refills | Status: DC

## 2020-01-10 MED ORDER — FLUCONAZOLE 150 MG PO TABS: 150.0000 mg | ORAL_TABLET | Freq: Once | ORAL | 0 refills | Status: AC

## 2020-01-10 MED ORDER — TRIAMCINOLONE ACETONIDE 0.1 % EX OINT: 1.0000 "application " | TOPICAL_OINTMENT | Freq: Two times a day (BID) | CUTANEOUS | 0 refills | Status: DC

## 2020-01-11 NOTE — Progress Notes (Signed)
History of Present Illness:  Ashley Moss is a 49 y.o. who presents for a blood pressure check. At the time of her annual exam eight months ago, her BP was 144/90. She was taking [place] OCPs ( generic for Alesse) at that time. Her blood pressure check a month later was still elevated so her BCP was changed to [employer]. She took the [employer] for 2 months then resumed taking the [place] when she ran out of [employer]. Her blood pressure recheck was 140/80.   Her birth control was then changed to a progestin only pill-Slynd.  Her blood pressure remained elevated on recheck, however, she was still recovering from Covid 19 infection. Her pill has since been changed to norethindrone due to cost. She can't remember if she had a menses at the end of her last pack of Slynd.   She also complains of vulvar burning intermittently x several weeks, worsening over the last few days. Burning sensation is worsened after she urinates. She denies unusual discharge. Does take bubble baths, but she has had no change in soaps and laundry products.   PMHx: She  has a past medical history of Amenorrhea, Gallstones, and Preeclampsia. Also,  has a past surgical history that includes Cesarean section (2001) and Cholecystectomy (N/A, 03/04/2016)., family history includes Brain cancer (age of onset: 75) in her paternal grandfather; Breast cancer (age of onset: 68) in an other family member; Heart attack in her maternal grandfather; Hyperlipidemia in her mother; Hypertension in her maternal grandfather, maternal grandmother, mother, and paternal grandmother; Other in her father.,  reports that she has never smoked. She has never used smokeless tobacco. She reports current alcohol use. She reports that she does not use drugs.  She has a current medication list which includes the following prescription(s): vitamin c, cholecalciferol, larissia, vitamin b-12, zinc gluconate, biotin, norethindrone 0.35mg m Also, has No Known  Allergies.  ROS-see HPI  Physical Exam:  BP 128/80   Ht 5\' 5"  (1.651 m)   BMI 24.63 kg/m  Body mass index is 24.63 kg/m. Constitutional: Well nourished, well developed female in no acute distress.  Pelvic exam: External/BUS: tender fissure below her urethra, with ?whitening in that area  Vagina: white cremy discharge  Cervix: no lesions Neuro: Grossly intact Psych:  Normal mood and affect.    Results for orders placed or performed in visit on 01/10/20 (from the past 48 hour(s))  POCT Wet Prep [facility])     Status: Normal   Collection Time: 01/12/20  9:33 PM  Result Value Ref Range   Source Wet Prep POC vagina    WBC, Wet Prep HPF POC moderate    Bacteria Wet Prep HPF POC     BACTERIA WET PREP MORPHOLOGY POC     Clue Cells Wet Prep HPF POC None None   Clue Cells Wet Prep Whiff POC     Yeast Wet Prep HPF POC None None   KOH Wet Prep POC     Trichomonas Wet Prep HPF POC Absent Absent   Urinalysis    Component Value Date/Time   BILIRUBINUR neg 01/10/2020 1204   PROTEINUR Negative 01/10/2020 1204   UROBILINOGEN negative (A) 01/10/2020 1204   NITRITE neg 01/10/2020 1204   LEUKOCYTESUR Negative 01/10/2020 1204     Assessment: Normotensive on minipill Periurethral fissure -unsure of etiology   Plan: Will have patient continue on norethindrone  Trial of Diflucan 150 mgm x1 an topical treatment with Nystatin ointment and Kenalog ointment 0.1% Follow up in  2-3 weeks or sooner for persistent or worsening symptoms.  Farrel Conners, CNM [facility], [facility]

## 2020-01-12 ENCOUNTER — Encounter: Payer: Self-pay | Admitting: Certified Nurse Midwife

## 2020-01-12 LAB — POCT WET PREP (WET MOUNT): Trichomonas Wet Prep HPF POC: ABSENT

## 2020-02-01 ENCOUNTER — Ambulatory Visit: Payer: Managed Care, Other (non HMO) | Admitting: Certified Nurse Midwife

## 2020-04-14 ENCOUNTER — Other Ambulatory Visit: Payer: Self-pay | Admitting: Certified Nurse Midwife

## 2020-04-14 ENCOUNTER — Other Ambulatory Visit: Payer: Self-pay | Admitting: Primary Care

## 2020-04-14 DIAGNOSIS — Z1231 Encounter for screening mammogram for malignant neoplasm of breast: Secondary | ICD-10-CM

## 2020-05-08 ENCOUNTER — Ambulatory Visit
Admission: RE | Admit: 2020-05-08 | Discharge: 2020-05-08 | Disposition: A | Discharge status: Home / Self Care | Payer: Managed Care, Other (non HMO) | Source: Ambulatory Visit | Attending: Certified Nurse Midwife | Admitting: Certified Nurse Midwife

## 2020-05-08 DIAGNOSIS — Z1231 Encounter for screening mammogram for malignant neoplasm of breast: Secondary | ICD-10-CM

## 2020-05-13 ENCOUNTER — Other Ambulatory Visit: Payer: Self-pay

## 2020-05-13 ENCOUNTER — Ambulatory Visit (INDEPENDENT_AMBULATORY_CARE_PROVIDER_SITE_OTHER): Payer: Managed Care, Other (non HMO) | Admitting: Obstetrics

## 2020-05-13 ENCOUNTER — Encounter: Payer: Self-pay | Admitting: Obstetrics

## 2020-05-13 VITALS — BP 142/82 | HR 61 | Wt 150.0 lb

## 2020-05-13 DIAGNOSIS — Z01419 Encounter for gynecological examination (general) (routine) without abnormal findings: Secondary | ICD-10-CM

## 2020-05-13 MED ORDER — NORETHINDRONE 0.35 MG PO TABS: 1.0000 | ORAL_TABLET | Freq: Every day | ORAL | 3 refills | Status: DC

## 2020-05-13 NOTE — Progress Notes (Signed)
Gynecology Annual Exam  PCP: Doreene Nest, NP  Chief Complaint:  Chief Complaint  Patient presents with  . Gynecologic Exam    History of Present Illness: Ashley Moss is a 49 y.o. G1P1001 presents for annual exam. The patient has no complaints today.  Her menses are regular, they occur every month, and they last 5  days. Her flow is light. She does not have intermenstrual bleeding. Her last menstrual period was 05/03/2020. She denies dysmenorrhea. Last pap smear: 2020, results were NILM   The patient is  sexually active. She currently uses the mini pill for contraception. She does not have dyspareunia.  Since her last visit, she has had no significant changes in her health.  Her past medical history is remarkable for one pregnancy with delivery via CS.  The patient does perform self breast exams. Her last mammogram was 2 weeks ago, results were normal .   There is a family history of breast cancer in her great aunt Genetic testing has not been done.   There is no family history of ovarian cancer. Genetic testing has not been done.  The patient denies smoking.  She drinks rarely   She denies illegal drug use.  The patient reports exercising occasionally.  The patient denies current symptoms of depression.    Review of Systems: Review of Systems  Constitutional: Negative.   HENT: Negative.   Eyes: Negative.   Cardiovascular: Negative.   Gastrointestinal: Negative.   Genitourinary: Negative.   Musculoskeletal: Negative.   Skin: Negative.   Neurological: Negative.   Endo/Heme/Allergies: Negative.   All other systems reviewed and are negative.   Past Medical History:  Past Medical History:  Diagnosis Date  . Amenorrhea   . Gallstones   . Preeclampsia     Past Surgical History:  Past Surgical History:  Procedure Laterality Date  . CESAREAN SECTION  2001  . CHOLECYSTECTOMY N/A 03/04/2016   Procedure: LAPAROSCOPIC CHOLECYSTECTOMY WITH  INTRAOPERATIVE CHOLANGIOGRAM;  Surgeon: Earline Mayotte, MD;  Location: [facility] ORS;  Service: General;  Laterality: N/A;    Family History:  Family History  Problem Relation Age of Onset  . Hyperlipidemia Mother        this may have resolved/2019  . Hypertension Mother   . Hypertension Maternal Grandmother   . Hypertension Maternal Grandfather   . Heart attack Maternal Grandfather   . Brain cancer Paternal Grandfather 66  . Breast cancer Other 60  . Other Father        Drowned in his 30s  . Hypertension Paternal Grandmother     Social History:  Social History   Socioeconomic History  . Marital status: Married    Spouse name: Tammy Sours  . Number of children: 1  . Years of education: Not on file  . Highest education level: Not on file  Occupational History  . Not on file  Tobacco Use  . Smoking status: Never Smoker  . Smokeless tobacco: Never Used  Vaping Use  . Vaping Use: Never used  Substance and Sexual Activity  . Alcohol use: Yes    Alcohol/week: 0.0 standard drinks    Comment: infrequent  . Drug use: No  . Sexual activity: Yes    Partners: Male    Birth control/protection: Pill  Other Topics Concern  . Not on file  Social History Narrative  . Not on file   Social Determinants of Health   Financial Resource Strain:   . Difficulty of Paying  Living Expenses: Not on file  Food Insecurity:   . Worried About Programme researcher, broadcasting/film/video in the Last Year: Not on file  . Ran Out of Food in the Last Year: Not on file  Transportation Needs:   . Lack of Transportation (Medical): Not on file  . Lack of Transportation (Non-Medical): Not on file  Physical Activity:   . Days of Exercise per Week: Not on file  . Minutes of Exercise per Session: Not on file  Stress:   . Feeling of Stress : Not on file  Social Connections:   . Frequency of Communication with Friends and Family: Not on file  . Frequency of Social Gatherings with Friends and Family: Not on file  . Attends  Religious Services: Not on file  . Active Member of Clubs or Organizations: Not on file  . Attends Banker Meetings: Not on file  . Marital Status: Not on file  Intimate Partner Violence:   . Fear of Current or Ex-Partner: Not on file  . Emotionally Abused: Not on file  . Physically Abused: Not on file  . Sexually Abused: Not on file    Allergies:  No Known Allergies  Medications: Prior to Admission medications   Medication Sig Start Date End Date Taking? Authorizing Provider  cholecalciferol (VITAMIN D3) 25 MCG (1000 UNIT) tablet Take 1,000 Units by mouth daily.   Yes [provider]  norethindrone (MICRONOR) 0.35 MG tablet Take 1 tablet (0.35 mg total) by mouth daily. 05/13/20  Yes Mirna Mires, CNM  vitamin B-12 (CYANOCOBALAMIN) 1000 MCG tablet Take 1,000 mcg by mouth daily.   Yes [provider]    Physical Exam Vitals: Blood pressure (!) 142/82, pulse 61, weight 150 lb (68 kg), last menstrual period 05/03/2020.  General: NAD HEENT: normocephalic, anicteric Neck: no thyroid enlargement, no palpable nodules, no cervical lymphadenopathy  Pulmonary: No increased work of breathing, CTAB Cardiovascular: RRR, without murmur  Breast: Breast symmetrical, no tenderness, no palpable nodules or masses, no skin or nipple retraction present, no nipple discharge.  No axillary, infraclavicular or supraclavicular lymphadenopathy. Abdomen: Soft, non-tender, non-distended.  Umbilicus without lesions.  No hepatomegaly or masses palpable. No evidence of hernia. Genitourinary:  External: Normal external female genitalia.  Normal urethral meatus, normal  Bartholin's and Skene's glands.    Vagina: Normal vaginal mucosa, no evidence of prolapse.    Cervix: Grossly normal in appearance, no bleeding, non-tender  Uterus: Anteverted, normal size, shape, and consistency, mobile, and non-tender  Adnexa: No adnexal masses, non-tender  Rectal: deferred  Lymphatic: no  evidence of inguinal lymphadenopathy Extremities: no edema, erythema, or tenderness Neurologic: Grossly intact Psychiatric: mood appropriate, affect full     Assessment: 49 y.o. G1P1001 No problem-specific Assessment & Plan notes found for this encounter.   Plan:  Well woman physical completed.  1) Breast cancer screening - recommend monthly self breast exam. Mammogram is up to date.  2) STI screening was offered and declined.  3) Cervical cancer screening - Pap was done. ASCCP guidelines and rational discussed.  Patient opts for every 3 years screening interval  4) Contraception - Education given regarding options for contraception  5) Routine healthcare maintenance including cholesterol and diabetes screening managed by PCP  RTC in one year for next annual.  Mirna Mires, CNM  05/13/2020 4:58 PM

## 2020-05-16 ENCOUNTER — Encounter: Payer: Self-pay | Admitting: Obstetrics

## 2020-05-16 LAB — IGP, APTIMA HPV, RFX 16/18,45: HPV Aptima: NEGATIVE

## 2020-10-16 ENCOUNTER — Telehealth: Payer: Self-pay

## 2020-10-16 DIAGNOSIS — Z01419 Encounter for gynecological examination (general) (routine) without abnormal findings: Secondary | ICD-10-CM

## 2020-10-16 MED ORDER — NORETHINDRONE 0.35 MG PO TABS: 1.0000 | ORAL_TABLET | Freq: Every day | ORAL | 2 refills | Status: DC

## 2020-10-16 NOTE — Telephone Encounter (Signed)
Request recv'd from [facility] for ortho micronor tab.  Left detailed msg for pt to let us know what pharm she prefers.

## 2020-10-16 NOTE — Telephone Encounter (Signed)
Pt states her bcp has been coming from [facility].  [facility]

## 2021-06-11 ENCOUNTER — Ambulatory Visit (INDEPENDENT_AMBULATORY_CARE_PROVIDER_SITE_OTHER): Payer: Managed Care, Other (non HMO) | Admitting: Obstetrics

## 2021-06-11 ENCOUNTER — Encounter: Payer: Self-pay | Admitting: Obstetrics

## 2021-06-11 ENCOUNTER — Other Ambulatory Visit: Payer: Self-pay

## 2021-06-11 ENCOUNTER — Other Ambulatory Visit: Payer: Self-pay | Admitting: Obstetrics

## 2021-06-11 VITALS — BP 122/74 | Ht 66.0 in | Wt 124.0 lb

## 2021-06-11 DIAGNOSIS — Z1231 Encounter for screening mammogram for malignant neoplasm of breast: Secondary | ICD-10-CM | POA: Diagnosis not present

## 2021-06-11 DIAGNOSIS — Z01419 Encounter for gynecological examination (general) (routine) without abnormal findings: Secondary | ICD-10-CM

## 2021-06-11 MED ORDER — NORETHINDRONE 0.35 MG PO TABS: 1.0000 | ORAL_TABLET | Freq: Every day | ORAL | 2 refills | Status: DC

## 2021-06-11 NOTE — Progress Notes (Signed)
Gynecology Annual Exam  PCP: Doreene Nest, NP  Chief Complaint:  Chief Complaint  Patient presents with   Annual Exam    History of Present Illness: Patient is a 50 y.o. G1P1001 presents for annual exam. The patient has no complaints today. She is due for her annual mammogram, and would like her POPs renewed. She denies any perimenpausal symptoms. Placed on POPs years ago due to HTN.  LMP: No LMP recorded (lmp unknown). Average Interval: irregular,   Duration of flow: 2 days Heavy Menses: no Clots: no Intermenstrual Bleeding: no Postcoital Bleeding: no Dysmenorrhea: no   The patient is sexually active. She currently uses oral progesterone-only contraceptive for contraception. She denies dyspareunia.  The patient does perform self breast exams.  There is no notable family history of breast or ovarian cancer in her family.  The patient wears seatbelts: yes.   The patient has regular exercise: no.    The patient denies current symptoms of depression.    Review of Systems: Review of Systems  Constitutional: Negative.   HENT: Negative.    Eyes: Negative.   Respiratory: Negative.    Cardiovascular: Negative.   Gastrointestinal: Negative.   Genitourinary: Negative.   Musculoskeletal: Negative.   Skin: Negative.   Neurological: Negative.   Endo/Heme/Allergies: Negative.   Psychiatric/Behavioral: Negative.     Past Medical History:  Patient Active Problem List   Diagnosis Date Noted   Raynaud's phenomenon 10/10/2018   Numbness and tingling 10/10/2018   Hair thinning 10/10/2018   Ocular headache 09/06/2017   Non-healing surgical wound 04/14/2016   Gallstones 02/12/2016    Past Surgical History:  Past Surgical History:  Procedure Laterality Date   CESAREAN SECTION  2001   CHOLECYSTECTOMY N/A 03/04/2016   Procedure: LAPAROSCOPIC CHOLECYSTECTOMY WITH INTRAOPERATIVE CHOLANGIOGRAM;  Surgeon: Earline Mayotte, MD;  Location: [facility] OR[facility];  Service: General;   Laterality: N/A;    Gynecologic History:  No LMP recorded (lmp unknown). Contraception: oral progesterone-only contraceptive Last Pap: Results were: NILM no abnormalities  Last mammogram: 2021 Results were: BI-RAD I  Obstetric History: G1P1001  Family History:  Family History  Problem Relation Age of Onset   Hyperlipidemia Mother        this may have resolved/2019   Hypertension Mother    Hypertension Maternal Grandmother    Hypertension Maternal Grandfather    Heart attack Maternal Grandfather    Brain cancer Paternal Grandfather 44   Breast cancer Other 57   Other Father        Drowned in his 30s   Hypertension Paternal Grandmother     Social History:  Social History   Socioeconomic History   Marital status: Married    Spouse name: Tammy Sours   Number of children: 1   Years of education: Not on file   Highest education level: Not on file  Occupational History   Not on file  Tobacco Use   Smoking status: Never   Smokeless tobacco: Never  Vaping Use   Vaping Use: Never used  Substance and Sexual Activity   Alcohol use: Yes    Alcohol/week: 0.0 standard drinks    Comment: infrequent   Drug use: No   Sexual activity: Yes    Partners: Male    Birth control/protection: Pill  Other Topics Concern   Not on file  Social History Narrative   Not on file   Social Determinants of Health   Financial Resource Strain: Not on file  Food Insecurity: Not on file  Transportation Needs: Not on file  Physical Activity: Not on file  Stress: Not on file  Social Connections: Not on file  Intimate Partner Violence: Not on file    Allergies:  No Known Allergies  Medications: Prior to Admission medications   Medication Sig Start Date End Date Taking? Authorizing Provider  norethindrone (MICRONOR) 0.35 MG tablet Take 1 tablet (0.35 mg total) by mouth daily. 06/11/21   Mirna Mires, CNM    Physical Exam Vitals: Blood pressure 122/74, height 5\' 6"  (1.676 m), weight 124  lb (56.2 kg).  General: NAD HEENT: normocephalic, anicteric Thyroid: no enlargement, no palpable nodules Pulmonary: No increased work of breathing, CTAB Cardiovascular: RRR, distal pulses 2+ Breast: Breast symmetrical, no tenderness, no palpable nodules or masses, no skin or nipple retraction present, no nipple discharge.  No axillary or supraclavicular lymphadenopathy. Abdomen: NABS, soft, non-tender, non-distended.  Umbilicus without lesions.  No hepatomegaly, splenomegaly or masses palpable. No evidence of hernia  Genitourinary:  External: Normal external female genitalia.  Normal urethral meatus, normal Bartholin's and Skene's glands.    Vagina: Normal vaginal mucosa, no evidence of prolapse.    Cervix: Grossly normal in appearance, no bleeding  Uterus: Non-enlarged, mobile, normal contour.  No CMT  Adnexa: ovaries non-enlarged, no adnexal masses  Rectal: deferred  Lymphatic: no evidence of inguinal lymphadenopathy Extremities: no edema, erythema, or tenderness Neurologic: Grossly intact Psychiatric: mood appropriate, affect full  Female chaperone present for pelvic and breast  portions of the physical exam    Assessment: 50 y.o. G1P1001 routine annual exam  Plan: Problem List Items Addressed This Visit   None Visit Diagnoses     Breast cancer screening by mammogram    -  Primary   Relevant Orders   MM DIGITAL SCREENING BILATERAL   Women's annual routine gynecological examination       Relevant Medications   norethindrone (MICRONOR) 0.35 MG tablet       1) Mammogram - recommend yearly screening mammogram.  Mammogram Was ordered today   2) STI screening  wasoffered and declined  3) ASCCP guidelines and rational discussed.  Patient opts for every 3 years screening interval  4) Contraception - the patient is currently using  oral progesterone-only contraceptive.  She is happy with her current form of contraception and plans to continue  5) Colonoscopy -- Screening  recommended starting at age 5 for average risk individuals, age 42 for individuals deemed at increased risk (including African Americans) and recommended to continue until age 9.  For patient age 43-85 individualized approach is recommended.  Gold standard screening is via colonoscopy, Cologuard screening is an acceptable alternative for patient unwilling or unable to undergo colonoscopy.  "Colorectal cancer screening for average?risk adults: 2018 guideline update from the American Cancer Society"CA: A Cancer Journal for Clinicians: Jan 26, 2017 Discussed this today, and she will talk to her PCP next year about Cologard.  6) Routine healthcare maintenance including cholesterol, diabetes screening discussed managed by PCP  7) No follow-ups on file.   January 28, 2017, CNM  06/11/2021 5:07 PM   [facility] 06/11/2021, 5:07 PM

## 2021-06-29 ENCOUNTER — Other Ambulatory Visit: Payer: Self-pay

## 2021-06-29 ENCOUNTER — Ambulatory Visit
Admission: RE | Admit: 2021-06-29 | Discharge: 2021-06-29 | Disposition: A | Discharge status: Home / Self Care | Payer: Managed Care, Other (non HMO) | Source: Ambulatory Visit | Attending: Obstetrics | Admitting: Obstetrics

## 2021-06-29 DIAGNOSIS — Z1231 Encounter for screening mammogram for malignant neoplasm of breast: Secondary | ICD-10-CM | POA: Insufficient documentation

## 2021-08-13 ENCOUNTER — Other Ambulatory Visit: Payer: Self-pay | Admitting: Obstetrics

## 2021-08-13 DIAGNOSIS — Z01419 Encounter for gynecological examination (general) (routine) without abnormal findings: Secondary | ICD-10-CM

## 2021-08-16 ENCOUNTER — Encounter: Payer: Self-pay | Admitting: Obstetrics

## 2021-08-17 ENCOUNTER — Other Ambulatory Visit: Payer: Self-pay

## 2022-06-20 ENCOUNTER — Other Ambulatory Visit: Payer: Self-pay | Admitting: Obstetrics

## 2022-06-20 DIAGNOSIS — Z01419 Encounter for gynecological examination (general) (routine) without abnormal findings: Secondary | ICD-10-CM

## 2022-06-21 ENCOUNTER — Telehealth: Payer: Self-pay

## 2022-06-21 DIAGNOSIS — Z01419 Encounter for gynecological examination (general) (routine) without abnormal findings: Secondary | ICD-10-CM

## 2022-06-21 MED ORDER — NORETHINDRONE 0.35 MG PO TABS: 1.0000 | ORAL_TABLET | Freq: Every day | ORAL | 0 refills | Status: DC

## 2022-06-21 NOTE — Telephone Encounter (Signed)
Patient called she has scheduled her Annual Exam for 08/31/2021. She will need her birth control refilled until her appointment time. Refill was sent to [facility]. Patient vebalized understanding.

## 2022-08-31 ENCOUNTER — Ambulatory Visit: Payer: Managed Care, Other (non HMO) | Admitting: Obstetrics

## 2022-09-01 ENCOUNTER — Encounter: Payer: Self-pay | Admitting: Obstetrics

## 2022-09-01 ENCOUNTER — Ambulatory Visit (INDEPENDENT_AMBULATORY_CARE_PROVIDER_SITE_OTHER): Payer: Managed Care, Other (non HMO) | Admitting: Obstetrics

## 2022-09-01 ENCOUNTER — Other Ambulatory Visit: Payer: Self-pay | Admitting: Obstetrics

## 2022-09-01 VITALS — BP 147/90 | HR 85 | Ht 66.0 in | Wt 127.3 lb

## 2022-09-01 DIAGNOSIS — Z1231 Encounter for screening mammogram for malignant neoplasm of breast: Secondary | ICD-10-CM

## 2022-09-01 DIAGNOSIS — Z01419 Encounter for gynecological examination (general) (routine) without abnormal findings: Secondary | ICD-10-CM | POA: Diagnosis not present

## 2022-09-01 DIAGNOSIS — Z1211 Encounter for screening for malignant neoplasm of colon: Secondary | ICD-10-CM

## 2022-09-01 MED ORDER — ZOLPIDEM TARTRATE 5 MG PO TABS: 5.0000 mg | ORAL_TABLET | Freq: Every evening | ORAL | 0 refills | Status: AC | PRN

## 2022-09-01 MED ORDER — PREMARIN 0.625 MG/GM VA CREA: 1.0000 | TOPICAL_CREAM | Freq: Every day | VAGINAL | 12 refills | Status: AC

## 2022-09-01 NOTE — Progress Notes (Signed)
SUBJECTIVE  HPI  Ashley Moss is a 52 y.o.-year-old G1P1001 who presents for an annual exam today. She would also like to discuss her vasomotor symptoms of menopause. She reports that she has not had a period in approximately 6 months. She reports that 5 years ago, she was amenorrheic for about 2 years. She has been on POPs. She has been having hot flashes during the night that wake her hourly. She feels that the hot flashes are brief and manageable, but the interrupted sleep has been difficult to manage. She has also had dyspareunia related to vaginal dryness. She denies mood swings and irritability, UTI symptoms, and abnormal vaginal discharge.   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 (Menstrual status: Oral contraceptives). Perimenopausal Last Pap: 05/13/20 - normal cytology, negative HPV Contraception: POP  Prevention Dentist: sees regularly Eye exam: seeking provider Mammogram: yearly Colonoscopy: has not yet had Flu shot/vaccines: declines  Current Medications Outpatient Medications Prior to Visit  Medication  Sig   norethindrone (ERRIN) 0.35 MG tablet Take 1 tablet (0.35 mg total) by mouth daily.   No facility-administered medications prior to visit.      The pregnancy intention screening data noted above was reviewed. Potential methods of contraception were discussed. The patient elected to proceed with No data recorded.   ROS History obtained from the patient General ROS: positive for  - fatigue, hot flashes, night sweats, and sleep disturbance negative for - chills, fever, or malaise Psychological ROS: negative for - anxiety, depression, irritability, or mood swings Ophthalmic ROS: positive for - decreased vision ENT ROS: negative for - headaches or sinus pain Hematological and Lymphatic ROS: negative for - bleeding problems, bruising, or swollen lymph nodes Endocrine ROS: negative for - breast changes, malaise/lethargy, mood swings, palpitations, skin changes, or unexpected weight changes Breast ROS: negative for breast lumps Respiratory ROS: no cough, shortness of breath, or wheezing Cardiovascular ROS: no chest pain or dyspnea on exertion Gastrointestinal ROS: no abdominal pain, change in bowel habits, or black or bloody stools Genito-Urinary ROS: no dysuria, trouble voiding, or hematuria positive for - dyspareunia and vaginal dryness Musculoskeletal ROS: negative Dermatological ROS: negative      No data to display           OBJECTIVE  Last Weight  Most recent update: 09/01/2022  2:10 PM    Weight  57.7 kg (127 lb  4.8 oz)             Body mass index is 20.55 kg/m.    BP (!) 147/90   Pulse 85   Ht 5\' 6"  (1.676 m)   Wt 127 lb 4.8 oz (57.7 kg)   LMP  (LMP Unknown)   BMI 20.55 kg/m  General appearance: alert, cooperative, and appears stated age Head: Normocephalic, without obvious abnormality, atraumatic Eyes: negative findings: lids and lashes normal and conjunctivae and sclerae normal Neck: no adenopathy, supple, symmetrical, trachea midline, and thyroid not  enlarged, symmetric, no tenderness/mass/nodules Lungs: clear to auscultation bilaterally Breasts:  declines exam (will have  mammogram) Heart: regular rate and rhythm, S1, S2 normal, no murmur, click, rub or gallop Abdomen: soft, non-tender; bowel sounds normal; no masses,  no organomegaly Pelvic:  declines exam Extremities: extremities normal, atraumatic, no cyanosis or edema Pulses: 2+ and symmetric Skin: Skin color, texture, turgor normal. No rashes or lesions Lymph nodes: Cervical, supraclavicular, and axillary nodes normal.  ASSESSMENT  1) Annual exam 2) Vasomotor symptoms of menopause 3) Sleep disturbance  PLAN 1) Physical exam as noted. Discussed healthy lifestyle choices and preventive care. Mammogram and colonoscopy ordered. Declines STI screening and routine labs. Pap due in 2026. 2) Discussed options for management of hot flashes and vaginal dryness, including dietary/lifestyle modifications, herbal, hormonal, and non-hormonal options. Jahmia would like to try vaginal estrogen cream for dryness. Will consider options for systemic hormones and SSRIs. 3) Rx for Ambien for occasional use. Recommend valerian root as herbal option for sleep.   Return for annual visit in one year or for further management of menopause symptoms.    Lloyd Huger, CNM

## 2022-09-07 ENCOUNTER — Other Ambulatory Visit: Payer: Self-pay

## 2022-09-07 ENCOUNTER — Telehealth: Payer: Self-pay

## 2022-09-07 ENCOUNTER — Other Ambulatory Visit: Payer: Self-pay | Admitting: Advanced Practice Midwife

## 2022-09-07 DIAGNOSIS — Z01419 Encounter for gynecological examination (general) (routine) without abnormal findings: Secondary | ICD-10-CM

## 2022-09-07 DIAGNOSIS — Z1211 Encounter for screening for malignant neoplasm of colon: Secondary | ICD-10-CM

## 2022-09-07 MED ORDER — NA SULFATE-K SULFATE-MG SULF 17.5-3.13-1.6 GM/177ML PO SOLN: 1.0000 | Freq: Once | ORAL | 0 refills | Status: AC

## 2022-09-07 NOTE — Telephone Encounter (Signed)
Gastroenterology Pre-Procedure Review  Request Date: 10/08/22 Requesting Physician: Dr. Marius Ditch  PATIENT REVIEW QUESTIONS: The patient responded to the following health history questions as indicated:    1. Are you having any GI issues? no 2. Do you have a personal history of Polyps? no 3. Do you have a family history of Colon Cancer or Polyps? no 4. Diabetes Mellitus? no 5. Joint replacements in the past 12 months?no 6. Major health problems in the past 3 months?no 7. Any artificial heart valves, MVP, or defibrillator?no    MEDICATIONS & ALLERGIES:    Patient reports the following regarding taking any anticoagulation/antiplatelet therapy:   Plavix, Coumadin, Eliquis, Xarelto, Lovenox, Pradaxa, Brilinta, or Effient? no Aspirin? no  Patient confirms/reports the following medications:  Current Outpatient Medications  Medication Sig Dispense Refill   conjugated estrogens (PREMARIN) vaginal cream Place 1 Applicatorful vaginally daily. 1 applicator in the vagina at bedtime for 3 weeks, then 2-3 times per week as needed 42.5 g 12   norethindrone (ERRIN) 0.35 MG tablet Take 1 tablet (0.35 mg total) by mouth daily. 84 tablet 0   zolpidem (AMBIEN) 5 MG tablet Take 1 tablet (5 mg total) by mouth at bedtime as needed for sleep. 30 tablet 0   No current facility-administered medications for this visit.    Patient confirms/reports the following allergies:  No Known Allergies  No orders of the defined types were placed in this encounter.   AUTHORIZATION INFORMATION Primary Insurance: 1D#: Group #:  Secondary Insurance: 1D#: Group #:  SCHEDULE INFORMATION: Date: 10/08/22 Time: Location: [facility]

## 2022-09-08 ENCOUNTER — Other Ambulatory Visit: Payer: Self-pay

## 2022-09-08 DIAGNOSIS — Z01419 Encounter for gynecological examination (general) (routine) without abnormal findings: Secondary | ICD-10-CM

## 2022-09-08 MED ORDER — NORETHINDRONE 0.35 MG PO TABS: 1.0000 | ORAL_TABLET | Freq: Every day | ORAL | 0 refills | Status: DC

## 2022-09-08 NOTE — Telephone Encounter (Signed)
Re sent medication previous CMA was sent in , patient made aware no concern at this moment.

## 2022-10-07 ENCOUNTER — Encounter: Payer: Self-pay | Admitting: Gastroenterology

## 2022-10-08 ENCOUNTER — Encounter: Payer: Self-pay | Admitting: Gastroenterology

## 2022-10-08 ENCOUNTER — Encounter
Admission: RE | Disposition: A | Discharge status: Home / Self Care | Payer: Self-pay | Source: Home / Self Care | Attending: Gastroenterology

## 2022-10-08 ENCOUNTER — Ambulatory Visit: Payer: Managed Care, Other (non HMO) | Admitting: Anesthesiology

## 2022-10-08 ENCOUNTER — Ambulatory Visit
Admission: RE | Admit: 2022-10-08 | Discharge: 2022-10-08 | Disposition: A | Discharge status: Home / Self Care | Payer: Managed Care, Other (non HMO) | Attending: Gastroenterology | Admitting: Gastroenterology

## 2022-10-08 DIAGNOSIS — Z9049 Acquired absence of other specified parts of digestive tract: Secondary | ICD-10-CM | POA: Diagnosis not present

## 2022-10-08 DIAGNOSIS — Z8759 Personal history of other complications of pregnancy, childbirth and the puerperium: Secondary | ICD-10-CM | POA: Diagnosis not present

## 2022-10-08 DIAGNOSIS — Z1211 Encounter for screening for malignant neoplasm of colon: Secondary | ICD-10-CM

## 2022-10-08 DIAGNOSIS — Z98891 History of uterine scar from previous surgery: Secondary | ICD-10-CM | POA: Diagnosis not present

## 2022-10-08 HISTORY — PX: COLONOSCOPY WITH PROPOFOL: SHX5780

## 2022-10-08 HISTORY — DX: Encounter for screening for malignant neoplasm of colon: Z12.11

## 2022-10-08 SURGERY — COLONOSCOPY WITH PROPOFOL
Anesthesia: General

## 2022-10-08 MED ORDER — PROPOFOL 10 MG/ML IV BOLUS
INTRAVENOUS | Status: DC | PRN
  Administered 2022-10-08: 70 mg via INTRAVENOUS

## 2022-10-08 MED ORDER — PROPOFOL 500 MG/50ML IV EMUL
INTRAVENOUS | Status: DC | PRN
  Administered 2022-10-08: 175 ug/kg/min via INTRAVENOUS

## 2022-10-08 MED ORDER — SODIUM CHLORIDE 0.9 % IV SOLN
INTRAVENOUS | Status: DC
  Administered 2022-10-08: 1000 mL via INTRAVENOUS

## 2022-10-08 MED ORDER — LIDOCAINE HCL (CARDIAC) PF 100 MG/5ML IV SOSY
PREFILLED_SYRINGE | INTRAVENOUS | Status: DC | PRN
  Administered 2022-10-08: 60 mg via INTRAVENOUS

## 2022-10-08 NOTE — Op Note (Signed)
[facility] Gastroenterology Patient Name: Ashley Moss Procedure Date: 10/08/2022 10:52 AM MRN: [id number] Account #: [id number] Date of Birth: February 05, 1971 Admit Type: Outpatient Age: 52 Room: [facility] ENDO ROOM 2 Gender: Female Note Status: Finalized Instrument Name: Peds Colonoscope [id number] Procedure:             Colonoscopy Indications:           Screening for colorectal malignant neoplasm, This is                         the patient's first colonoscopy Providers:             Lin Landsman MD, MD Referring MD:          Pleas Koch (Referring MD) Medicines:             General Anesthesia Complications:         No immediate complications. Estimated blood loss: None. Procedure:             Pre-Anesthesia Assessment:                        - Prior to the procedure, a History and Physical was                         performed, and patient medications and allergies were                         reviewed. The patient is competent. The risks and                         benefits of the procedure and the sedation options and                         risks were discussed with the patient. All questions                         were answered and informed consent was obtained.                         Patient identification and proposed procedure were                         verified by the physician, the nurse, the                         anesthesiologist, the anesthetist and the technician                         in the pre-procedure area in the procedure room in the                         endoscopy suite. Mental Status Examination: alert and                         oriented. Airway Examination: normal oropharyngeal                         airway and neck mobility. Respiratory Examination:  clear to auscultation. CV Examination: normal.                         Prophylactic Antibiotics: The patient does not require                          prophylactic antibiotics. Prior Anticoagulants: The                         patient has taken no anticoagulant or antiplatelet                         agents. ASA Grade Assessment: I - A normal, healthy                         patient. After reviewing the risks and benefits, the                         patient was deemed in satisfactory condition to                         undergo the procedure. The anesthesia plan was to use                         general anesthesia. Immediately prior to                         administration of medications, the patient was                         re-assessed for adequacy to receive sedatives. The                         heart rate, respiratory rate, oxygen saturations,                         blood pressure, adequacy of pulmonary ventilation, and                         response to care were monitored throughout the                         procedure. The physical status of the patient was                         re-assessed after the procedure.                        After obtaining informed consent, the colonoscope was                         passed under direct vision. Throughout the procedure,                         the patient's blood pressure, pulse, and oxygen                         saturations were monitored continuously. The  Colonoscope was introduced through the anus and                         advanced to the the cecum, identified by appendiceal                         orifice and ileocecal valve. The colonoscopy was                         performed without difficulty. The patient tolerated                         the procedure well. The quality of the bowel                         preparation was evaluated using the BBPS [facility] Bowel                         Preparation Scale) with scores of: Right Colon = 3,                         Transverse Colon = 3 and Left Colon = 3 (entire mucosa                         seen  well with no residual staining, small fragments                         of stool or opaque liquid). The total BBPS score                         equals 9. The ileocecal valve, appendiceal orifice,                         and rectum were photographed. Findings:      The perianal and digital rectal examinations were normal. Pertinent       negatives include normal sphincter tone and no palpable rectal lesions.      The entire examined colon appeared normal.      The retroflexed view of the distal rectum and anal verge was normal and       showed no anal or rectal abnormalities. Impression:            - The entire examined colon is normal.                        - The distal rectum and anal verge are normal on                         retroflexion view.                        - No specimens collected. Recommendation:        - Discharge patient to home (with escort).                        - Resume previous diet today.                        -  Continue present medications.                        - Repeat colonoscopy in 10 years for screening                         purposes. Procedure Code(s):     --- Professional ---                        [id number], Colorectal cancer screening; colonoscopy on                         individual not meeting criteria for high risk Diagnosis Code(s):     --- Professional ---                        Z12.11, Encounter for screening for malignant neoplasm                         of colon CPT copyright 2022 American Medical Association. All rights reserved. The codes documented in this report are preliminary and upon coder review may  be revised to meet current compliance requirements. Dr. Ulyess Mort Lin Landsman MD, MD 10/08/2022 11:36:23 AM This report has been signed electronically. Number of Addenda: 0 Note Initiated On: 10/08/2022 10:52 AM Scope Withdrawal Time: 0 hours 9 minutes 20 seconds  Total Procedure Duration: 0 hours 14 minutes 58 seconds   Estimated Blood Loss:  Estimated blood loss: none. Estimated blood loss: none.      [facility]

## 2022-10-08 NOTE — Anesthesia Postprocedure Evaluation (Signed)
Anesthesia Post Note  Patient: Ashley Moss  Procedure(s) Performed: COLONOSCOPY WITH PROPOFOL  Patient location during evaluation: PACU Anesthesia Type: General Level of consciousness: awake and oriented Pain management: pain level controlled Vital Signs Assessment: post-procedure vital signs reviewed and stable Respiratory status: spontaneous breathing Cardiovascular status: stable Anesthetic complications: no  No notable events documented.   Last Vitals:  Vitals:   10/08/22 1139 10/08/22 1149  BP: 109/60 112/80  Pulse: 85 71  Resp: 16 14  Temp:    SpO2: 100% 100%    Last Pain:  Vitals:   10/08/22 1050  TempSrc: Temporal                 VAN STAVEREN,Zafir Schauer

## 2022-10-08 NOTE — Anesthesia Preprocedure Evaluation (Addendum)
Anesthesia Evaluation  Patient identified by MRN, date of birth, ID band Patient awake    Reviewed: Allergy & Precautions, NPO status , Patient's Chart, lab work & pertinent test results  Airway Mallampati: II  TM Distance: >3 FB Neck ROM: full    Dental  (+) Teeth Intact   Pulmonary neg pulmonary ROS   Pulmonary exam normal breath sounds clear to auscultation       Cardiovascular Exercise Tolerance: Good Normal cardiovascular exam Rhythm:Regular Rate:Normal     Neuro/Psych  Headaches negative neurological ROS  negative psych ROS   GI/Hepatic negative GI ROS, Neg liver ROS,,,  Endo/Other  negative endocrine ROS    Renal/GU negative Renal ROS  negative genitourinary   Musculoskeletal   Abdominal Normal abdominal exam  (+)   Peds negative pediatric ROS (+)  Hematology negative hematology ROS (+)   Anesthesia Other Findings Past Medical History: No date: Amenorrhea No date: Gallstones No date: Preeclampsia  Past Surgical History: 2001: CESAREAN SECTION 03/04/2016: CHOLECYSTECTOMY; N/A     Comment:  Procedure: LAPAROSCOPIC CHOLECYSTECTOMY WITH               INTRAOPERATIVE CHOLANGIOGRAM;  Surgeon: Robert Bellow, MD;  Location: [facility] ORS;  Service: General;                Laterality: N/A;     Reproductive/Obstetrics negative OB ROS                             Anesthesia Physical Anesthesia Plan  ASA: 1  Anesthesia Plan: General   Post-op Pain Management:    Induction: Intravenous  PONV Risk Score and Plan: Propofol infusion and TIVA  Airway Management Planned: Natural Airway  Additional Equipment:   Intra-op Plan:   Post-operative Plan:   Informed Consent: I have reviewed the patients History and Physical, chart, labs and discussed the procedure including the risks, benefits and alternatives for the proposed anesthesia with the patient or authorized  representative who has indicated his/her understanding and acceptance.     Dental Advisory Given  Plan Discussed with: CRNA and Surgeon  Anesthesia Plan Comments:        Anesthesia Quick Evaluation

## 2022-10-08 NOTE — H&P (Signed)
Cephas Darby, MD [address]  [place]  [place], [place] [address]  Main: [phone]  Fax: [phone] Pager: [phone]  Primary Care Physician:  Pleas Koch, NP Primary Gastroenterologist:  Dr. Cephas Darby  Pre-Procedure History & Physical: HPI:  Ashley Moss is a 52 y.o. female is here for an colonoscopy.   Past Medical History:  Diagnosis Date   Amenorrhea    Gallstones    Preeclampsia     Past Surgical History:  Procedure Laterality Date   CESAREAN SECTION  2001   CHOLECYSTECTOMY N/A 03/04/2016   Procedure: LAPAROSCOPIC CHOLECYSTECTOMY WITH INTRAOPERATIVE CHOLANGIOGRAM;  Surgeon: Robert Bellow, MD;  Location: [facility] ORS;  Service: General;  Laterality: N/A;    Prior to Admission medications   Medication Sig Start Date End Date Taking? Authorizing Provider  conjugated estrogens (PREMARIN) vaginal cream Place 1 Applicatorful vaginally daily. 1 applicator in the vagina at bedtime for 3 weeks, then 2-3 times per week as needed 09/01/22  Yes Swanson, Ian Bushman, CNM  norethindrone (ERRIN) 0.35 MG tablet Take 1 tablet (0.35 mg total) by mouth daily. 09/08/22  Yes Rod Can, CNM  zolpidem (AMBIEN) 5 MG tablet Take 1 tablet (5 mg total) by mouth at bedtime as needed for sleep. 09/01/22   Lurlean Horns, CNM    Allergies as of 09/07/2022   (No Known Allergies)    Family History  Problem Relation Age of Onset   Hyperlipidemia Mother        this may have resolved/2019   Hypertension Mother    Hypertension Maternal Grandmother    Hypertension Maternal Grandfather    Heart attack Maternal Grandfather    Brain cancer Paternal Grandfather 104   Breast cancer Other 94   Other Father        Drowned in his 22s   Hypertension Paternal Grandmother     Social History   Socioeconomic History   Marital status: Married    Spouse name: Marya Amsler   Number of children: 1   Years of education: Not on file   Highest education level: Not on file   Occupational History   Not on file  Tobacco Use   Smoking status: Never   Smokeless tobacco: Never  Vaping Use   Vaping Use: Never used  Substance and Sexual Activity   Alcohol use: Yes    Alcohol/week: 0.0 standard drinks of alcohol    Comment: infrequent   Drug use: No   Sexual activity: Yes    Partners: Male    Birth control/protection: Pill  Other Topics Concern   Not on file  Social History Narrative   Not on file   Social Determinants of Health   Financial Resource Strain: Not on file  Food Insecurity: Not on file  Transportation Needs: Not on file  Physical Activity: Insufficiently Active (10/10/2018)   Exercise Vital Sign    Days of Exercise per Week: 3 days    Minutes of Exercise per Session: 30 min  Stress: No Stress Concern Present (10/10/2018)   [place]    Feeling of Stress : Not at all  Social Connections: Moderately Integrated (10/10/2018)   Social Connection and Isolation Panel [NHANES]    Frequency of Communication with Friends and Family: More than three times a week    Frequency of Social Gatherings with Friends and Family: Once a week    Attends Religious Services: More than 4 times per year  Active Member of Clubs or Organizations: No    Attends Archivist Meetings: Never    Marital Status: Married  Human resources officer Violence: Not At Risk (10/10/2018)   Humiliation, Afraid, Rape, and Kick questionnaire    Fear of Current or Ex-Partner: No    Emotionally Abused: No    Physically Abused: No    Sexually Abused: No    Review of Systems: See HPI, otherwise negative ROS  Physical Exam: BP (!) 143/85   Pulse 83   Temp (!) 96.4 F (35.8 C) (Temporal)   Resp 18   Ht 5' 6"$  (1.676 m)   Wt 56.8 kg   SpO2 100%   BMI 20.21 kg/m  General:   Alert,  pleasant and cooperative in NAD Head:  Normocephalic and atraumatic. Neck:  Supple; no masses or thyromegaly. Lungs:  Clear  throughout to auscultation.    Heart:  Regular rate and rhythm. Abdomen:  Soft, nontender and nondistended. Normal bowel sounds, without guarding, and without rebound.   Neurologic:  Alert and  oriented x4;  grossly normal neurologically.  Impression/Plan: Ashley Moss is here for an colonoscopy to be performed for colon cancer screening  Risks, benefits, limitations, and alternatives regarding  colonoscopy have been reviewed with the patient.  Questions have been answered.  All parties agreeable.   Sherri Sear, MD  10/08/2022, 10:58 AM

## 2022-10-08 NOTE — Transfer of Care (Signed)
Immediate Anesthesia Transfer of Care Note  Patient: Ashley Moss  Procedure(s) Performed: COLONOSCOPY WITH PROPOFOL  Patient Location: PACU  Anesthesia Type:General  Level of Consciousness: awake, alert , and oriented  Airway & Oxygen Therapy: Patient Spontanous Breathing  Post-op Assessment: Report given to RN and Post -op Vital signs reviewed and stable  Post vital signs: Reviewed and stable  Last Vitals:  Vitals Value Taken Time  BP    Temp    Pulse    Resp    SpO2      Last Pain:  Vitals:   10/08/22 1050  TempSrc: Temporal         Complications: No notable events documented.

## 2022-10-11 ENCOUNTER — Encounter: Payer: Self-pay | Admitting: Gastroenterology

## 2022-10-21 ENCOUNTER — Ambulatory Visit
Admission: RE | Admit: 2022-10-21 | Discharge: 2022-10-21 | Disposition: A | Discharge status: Home / Self Care | Payer: Managed Care, Other (non HMO) | Source: Ambulatory Visit | Attending: Obstetrics | Admitting: Obstetrics

## 2022-10-21 DIAGNOSIS — Z1231 Encounter for screening mammogram for malignant neoplasm of breast: Secondary | ICD-10-CM

## 2022-11-25 ENCOUNTER — Other Ambulatory Visit: Payer: Self-pay | Admitting: Advanced Practice Midwife

## 2022-11-25 DIAGNOSIS — Z01419 Encounter for gynecological examination (general) (routine) without abnormal findings: Secondary | ICD-10-CM

## 2023-05-17 ENCOUNTER — Telehealth: Payer: Self-pay

## 2023-05-17 NOTE — Telephone Encounter (Signed)
Pt calling; at last visit she and MMS discussed HRT vs Natural remedies and decided to try the natural remedies.  Pt states now her sxs are worse - mood swings, depression, and not sleeping.  Can she have a low dose HRT?  [phone]  Pt aware MMS not in office until Thurs; uses [facility].

## 2023-05-18 NOTE — Telephone Encounter (Signed)
I contacted the patient via phone, she has confirmed virtual video visit with Missy for tomorrow 9/19 at 1:55 pm

## 2023-05-19 ENCOUNTER — Telehealth: Payer: Managed Care, Other (non HMO) | Admitting: Obstetrics

## 2023-05-19 VITALS — Ht 65.0 in | Wt 133.0 lb

## 2023-05-19 DIAGNOSIS — R232 Flushing: Secondary | ICD-10-CM

## 2023-05-19 NOTE — Progress Notes (Signed)
Virtual Visit via Video Note  I connected with Glory Rosebush on 05/19/23 at  1:55 PM EDT by a video enabled telemedicine application and verified that I am speaking with the correct person using two identifiers.  Location: Patient: home Provider: office   I discussed the limitations of evaluation and management by telemedicine and the availability of in person appointments. The patient expressed understanding and agreed to proceed.  History of Present Illness: Leeloo has been having increasingly worse hot flashes over the past several months. She has been experiencing hourly hot flashes at night for several month. For while, she was able to manage these with herbal remedies. She stopped taking the herbs, and her hot flashes returned. They were no longer responsive to herbs. She also tried Ambien and Tylenol PM with no improvement in her sleep. She is not sure if she is menopausal. She had a period of approximately 2 years in which she did not have a period, but then she began cycling again. She is currently on POPs and does not get a period but does get cyclical mood swings and menstrual sensations. She occasionally has minor hot flashes during the day. She has gained 10 lbs over the past few months and is feeling like she is not finding much enjoyment in life lately.    Observations/Objective:   Assessment and Plan: -Hot flashes causing sleep disturbance  We discussed various options for hormone therapy and the need to determine if she is menopausal yet. Instructed to stop POPs and have FSH level drawn after 10-14 days off of progesterone.  Consider OCPs or low dose estrogen patch or pills in conjunction with POPs.  Follow Up Instructions: -Schedule lab-only visit for 10-14 days from now -In-person or video visit for 3-5 days after lab draw  I discussed the assessment and treatment plan with the patient. The patient was provided an opportunity to ask questions and all were  answered. The patient agreed with the plan and demonstrated an understanding of the instructions.    I provided 15 minutes of non-face-to-face time during this encounter.   Glenetta Borg, CNM

## 2023-05-31 ENCOUNTER — Other Ambulatory Visit: Payer: Managed Care, Other (non HMO)

## 2023-05-31 DIAGNOSIS — R232 Flushing: Secondary | ICD-10-CM

## 2023-06-01 LAB — FOLLICLE STIMULATING HORMONE: FSH: 85.5 m[IU]/mL

## 2023-06-03 ENCOUNTER — Ambulatory Visit: Payer: Managed Care, Other (non HMO) | Admitting: Obstetrics

## 2023-06-03 ENCOUNTER — Encounter: Payer: Self-pay | Admitting: Obstetrics

## 2023-06-03 VITALS — BP 145/97 | HR 73 | Ht 65.0 in | Wt 137.0 lb

## 2023-06-03 DIAGNOSIS — Z79890 Hormone replacement therapy (postmenopausal): Secondary | ICD-10-CM | POA: Diagnosis not present

## 2023-06-03 DIAGNOSIS — N951 Menopausal and female climacteric states: Secondary | ICD-10-CM

## 2023-06-03 MED ORDER — ESTRADIOL-NORETHINDRONE ACET 1-0.5 MG PO TABS: 1.0000 | ORAL_TABLET | Freq: Every day | ORAL | 2 refills | Status: DC

## 2023-06-03 NOTE — Progress Notes (Signed)
GYN ENCOUNTER  Subjective  HPI: Ashley Moss is a 52 y.o. G1P1001 who presents today for HRT counseling. She has been having nightly hot flashes, poor sleep, irritability, and weight gain. She has not had a period in over a year. Her FSH is 85.5 mIU/mL. She is interested in starting hormone replacement therapy.   Past Medical History:  Diagnosis Date   Amenorrhea    Gallstones    Preeclampsia    Past Surgical History:  Procedure Laterality Date   CESAREAN SECTION  2001   CHOLECYSTECTOMY N/A 03/04/2016   Procedure: LAPAROSCOPIC CHOLECYSTECTOMY WITH INTRAOPERATIVE CHOLANGIOGRAM;  Surgeon: Earline Mayotte, MD;  Location: [facility] ORS;  Service: General;  Laterality: N/A;   COLONOSCOPY WITH PROPOFOL N/A 10/08/2022   Procedure: COLONOSCOPY WITH PROPOFOL;  Surgeon: Toney Reil, MD;  Location: [facility] ENDOSCOPY;  Service: Gastroenterology;  Laterality: N/A;   OB History     Gravida  1   Para  1   Term  1   Preterm      AB      Living  1      SAB      IAB      Ectopic      Multiple      Live Births  1        Obstetric Comments  1st Menstrual Cycle: 12 1st Pregnancy:  24        No Known Allergies  ROS: See HPI  Objective  BP (!) 145/97   Pulse 73   Ht 5\' 5"  (1.651 m)   Wt 137 lb (62.1 kg)   BMI 22.80 kg/m   Physical examination Not medically indicated  Assessment Vasomotor symptoms of menopause, desires HRT  Plan  HRT I have discussed HRT with the patient in detail.  The risk/benefits of it were reviewed.  She understands that during menopause estrogen decreases dramatically and that this results in an increased risk of cardiovascular disease as well as osteoporosis.  We have also discussed the fact that hot flashes often result from a decrease in estrogen, and that by replacing estrogen, they can often be alleviated.  We have discussed skin, vaginal and urinary tract changes that may also take place from this drop in estrogen.  Emotional  changes have also been linked to estrogen and we have briefly discussed this.  The benefits of HRT including decrease in hot flashes, vaginal dryness, and osteoporosis were discussed.  The emotional benefit and a possible change in her cardiovascular risk profile was also reviewed.  The risks associated with Hormone Replacement Therapy were also reviewed.  The use of unopposed estrogen and its relationship to endometrial cancer was discussed.  The addition of progesterone and its beneficial effect on endometrial cancer was also noted.  The fact that there has been no consistent definitive studies showing an increase in breast cancer in women who use HRT was discussed with the patient.  The possible side effects including breast tenderness, fluid retention, mood changes and vaginal bleeding were discussed.  The patient was informed that this is an elective medication and that she may choose not to take Hormone Replacement Therapy.  Literature on HRT was made available, and I believe that after answering all of the patient's questions.  Special emphasis on the WHI study, as well as several studies since that pertaining to the risks and benefits of estrogen replacement therapy were compared.  The possible limitations of these studies were discussed including the age stratification of  the WHI study.  The possible increased role of progesterone in these studies was discussed in detail.  Different types of hormone formulation and methods of taking hormone replacement therapy were discussed. Literature on HRT was made available, and I believe that after answering all of the patient's questions she has an adequate and informed understanding of the risks and benefits of HRT. Rx sent for Activella RTC in 3 months for follow up.   Guadlupe Spanish, CNM

## 2023-07-31 ENCOUNTER — Other Ambulatory Visit: Payer: Self-pay | Admitting: Obstetrics

## 2023-08-15 ENCOUNTER — Other Ambulatory Visit: Payer: Self-pay

## 2023-08-15 MED ORDER — ESTRADIOL-NORETHINDRONE ACET 1-0.5 MG PO TABS: 1.0000 | ORAL_TABLET | Freq: Every day | ORAL | 2 refills | Status: DC

## 2023-09-13 ENCOUNTER — Ambulatory Visit (INDEPENDENT_AMBULATORY_CARE_PROVIDER_SITE_OTHER): Payer: Managed Care, Other (non HMO) | Admitting: Obstetrics

## 2023-09-13 ENCOUNTER — Encounter: Payer: Self-pay | Admitting: Obstetrics

## 2023-09-13 VITALS — BP 141/87 | HR 82 | Ht 65.0 in

## 2023-09-13 DIAGNOSIS — Z1231 Encounter for screening mammogram for malignant neoplasm of breast: Secondary | ICD-10-CM

## 2023-09-13 DIAGNOSIS — Z0000 Encounter for general adult medical examination without abnormal findings: Secondary | ICD-10-CM | POA: Diagnosis not present

## 2023-09-13 DIAGNOSIS — Z79890 Hormone replacement therapy (postmenopausal): Secondary | ICD-10-CM | POA: Insufficient documentation

## 2023-09-13 MED ORDER — ESTRADIOL-NORETHINDRONE ACET 1-0.5 MG PO TABS: 1.0000 | ORAL_TABLET | Freq: Every day | ORAL | 3 refills | Status: DC

## 2023-09-13 NOTE — Progress Notes (Signed)
 ANNUAL GYNECOLOGICAL EXAM  SUBJECTIVE  HPI  Ashley Moss is a 53 y.o.-year-old G1P1001 who presents for an annual gynecological exam today.  She denies pelvic pain, dyspareunia, abnormal vaginal bleeding or discharge, and UTI symptoms. She hot flashes have improved significantly since starting Activella. She also reports that her sleep and mood have improved.  Medical/Surgical History Past Medical History:  Diagnosis Date   Amenorrhea    Gallstones    Preeclampsia    Past Surgical History:  Procedure Laterality Date   CESAREAN SECTION  2001   CHOLECYSTECTOMY N/A 03/04/2016   Procedure: LAPAROSCOPIC CHOLECYSTECTOMY WITH INTRAOPERATIVE CHOLANGIOGRAM;  Surgeon: Reyes LELON Cota, MD;  Location: [facility];  Service: General;  Laterality: N/A;   COLONOSCOPY WITH PROPOFOL  N/A 10/08/2022   Procedure: COLONOSCOPY WITH PROPOFOL ;  Surgeon: Unk Corinn Skiff, MD;  Location: [facility];  Service: Gastroenterology;  Laterality: N/A;    Social History Lives with husband and son. 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 Denies tobacco, vape, and recreational drugs  Obstetric History OB History     Gravida  1   Para  1   Term  1   Preterm      AB      Living  1      SAB      IAB      Ectopic      Multiple      Live Births  1        Obstetric Comments  1st Menstrual Cycle: 12 1st Pregnancy:  24          GYN/Menstrual History Patient is menopausal Last Pap: 05/13/20. NILM, neg HPV Contraception: N/A  Prevention Endorses regular dental and eye exams Mammogram: yearly Colonoscopy: had last year - normal Flu shot/vaccines: declines  Current Medications Outpatient Medications Prior to Visit  Medication Sig   zolpidem  (AMBIEN ) 5 MG tablet Take 1 tablet (5 mg total) by mouth at bedtime as needed for sleep.   conjugated estrogens  (PREMARIN ) vaginal cream Place 1 Applicatorful vaginally daily. 1  applicator in the vagina at bedtime for 3 weeks, then 2-3 times per week as needed (Patient not taking: Reported on 09/13/2023)   [DISCONTINUED] estradiol -norethindrone  (ACTIVELLA) 1-0.5 MG tablet Take 1 tablet by mouth daily. (Patient not taking: Reported on 09/13/2023)   No facility-administered medications prior to visit.       ROS Constitutional: Denied constitutional symptoms, night sweats, recent illness, fatigue, fever, insomnia and weight loss.  Eyes: Denied eye symptoms, eye pain, photophobia, vision change and visual disturbance.  Ears/Nose/Throat/Neck: Denied ear, nose, throat or neck symptoms, hearing loss, nasal discharge, sinus congestion and sore throat.  Cardiovascular: Denied cardiovascular symptoms, arrhythmia, chest pain/pressure, edema, exercise intolerance, orthopnea and palpitations.  Respiratory: Denied pulmonary symptoms, asthma, pleuritic pain, productive sputum, cough, dyspnea and wheezing.  Gastrointestinal: Denied, gastro-esophageal reflux, melena, nausea and vomiting.  Genitourinary: Denied genitourinary symptoms including symptomatic vaginal discharge, pelvic relaxation issues, and urinary complaints.  Musculoskeletal: Denied musculoskeletal symptoms, stiffness, swelling, muscle weakness and myalgia.  Dermatologic: Denied dermatology symptoms, rash and scar.  Neurologic: Denied neurology symptoms, dizziness, headache, neck pain and syncope.  Psychiatric: Denied psychiatric symptoms, anxiety and depression.  Endocrine: Denied endocrine symptoms including hot flashes and night sweats.    OBJECTIVE  BP (!) 141/87   Pulse 82   Ht 5' 5 (1.651 m)   BMI 22.80 kg/m    Physical examination General NAD, Conversant  HEENT Atraumatic; Op clear with mmm.  Normo-cephalic. Pupils reactive. Anicteric sclerae  Thyroid/Neck Smooth without nodularity or enlargement. Normal ROM.  Neck Supple.  Skin No rashes, lesions or ulceration. Normal palpated skin turgor. No  nodularity.  Breasts: Declined  Lungs: Clear to auscultation.No rales or wheezes. Normal Respiratory effort, no retractions.  Heart: NSR.  No murmurs or rubs appreciated. No peripheral edema  Abdomen: Soft.  Non-tender.  No masses.  No HSM. No hernia  Extremities: Moves all appropriately.  Normal ROM for age. No lymphadenopathy.  Neuro: Oriented to PPT.  Normal mood. Normal affect.     Pelvic: declined    ASSESSMENT  1) Annual exam 2) Elevated BP in office 3) HRT follow up  PLAN 1) Physical exam as noted. Discussed healthy lifestyle choices and preventive care. Declines STI testing. Labs done with employer. Mammogram ordered. 2) Encouraged to set appt with PCP to f/u on BPs. Hayley will also check pressures at home. 3) Refills of Activella ordered. Reviewed when to call.  Return in one year for annual exam or as needed for concerns.   Ramsay Bognar, CNM

## 2023-12-14 ENCOUNTER — Ambulatory Visit
Admission: RE | Admit: 2023-12-14 | Discharge: 2023-12-14 | Disposition: A | Discharge status: Home / Self Care | Source: Ambulatory Visit | Attending: Obstetrics | Admitting: Obstetrics

## 2023-12-14 DIAGNOSIS — Z1231 Encounter for screening mammogram for malignant neoplasm of breast: Secondary | ICD-10-CM | POA: Diagnosis present

## 2024-07-28 ENCOUNTER — Other Ambulatory Visit: Payer: Self-pay | Admitting: Obstetrics

## 2024-08-10 ENCOUNTER — Other Ambulatory Visit: Payer: Self-pay | Admitting: Obstetrics
